# Patient Record
Sex: Male | Born: 1965 | Race: White | Hispanic: No | Marital: Single | Smoking: Never smoker
Health system: Southern US, Community
[De-identification: ages and names within clinical notes are randomized; demographics above are authoritative.]

## PROBLEM LIST (undated history)

## (undated) DIAGNOSIS — G8929 Other chronic pain: Secondary | ICD-10-CM

## (undated) DIAGNOSIS — F32A Depression, unspecified: Secondary | ICD-10-CM

## (undated) DIAGNOSIS — G40909 Epilepsy, unspecified, not intractable, without status epilepticus: Secondary | ICD-10-CM

## (undated) DIAGNOSIS — R519 Headache, unspecified: Secondary | ICD-10-CM

## (undated) DIAGNOSIS — G114 Hereditary spastic paraplegia: Secondary | ICD-10-CM

## (undated) DIAGNOSIS — R9439 Abnormal result of other cardiovascular function study: Secondary | ICD-10-CM

## (undated) DIAGNOSIS — E119 Type 2 diabetes mellitus without complications: Secondary | ICD-10-CM

## (undated) DIAGNOSIS — K648 Other hemorrhoids: Secondary | ICD-10-CM

## (undated) DIAGNOSIS — G894 Chronic pain syndrome: Secondary | ICD-10-CM

## (undated) DIAGNOSIS — K219 Gastro-esophageal reflux disease without esophagitis: Secondary | ICD-10-CM

## (undated) DIAGNOSIS — M5030 Other cervical disc degeneration, unspecified cervical region: Secondary | ICD-10-CM

## (undated) DIAGNOSIS — E876 Hypokalemia: Secondary | ICD-10-CM

## (undated) DIAGNOSIS — G4733 Obstructive sleep apnea (adult) (pediatric): Secondary | ICD-10-CM

## (undated) DIAGNOSIS — M810 Age-related osteoporosis without current pathological fracture: Secondary | ICD-10-CM

## (undated) DIAGNOSIS — F329 Major depressive disorder, single episode, unspecified: Secondary | ICD-10-CM

## (undated) DIAGNOSIS — E782 Mixed hyperlipidemia: Secondary | ICD-10-CM

## (undated) DIAGNOSIS — G4730 Sleep apnea, unspecified: Secondary | ICD-10-CM

## (undated) DIAGNOSIS — I10 Essential (primary) hypertension: Secondary | ICD-10-CM

## (undated) DIAGNOSIS — M549 Dorsalgia, unspecified: Secondary | ICD-10-CM

## (undated) DIAGNOSIS — E559 Vitamin D deficiency, unspecified: Secondary | ICD-10-CM

## (undated) HISTORY — DX: Other cervical disc degeneration, unspecified cervical region: M50.30

## (undated) HISTORY — DX: Age-related osteoporosis without current pathological fracture: M81.0

## (undated) HISTORY — PX: OTHER SURGICAL HISTORY: SHX169

## (undated) HISTORY — DX: Other hemorrhoids: K64.8

## (undated) HISTORY — DX: Headache, unspecified: R51.9

## (undated) HISTORY — DX: Major depressive disorder, single episode, unspecified: F32.9

## (undated) HISTORY — DX: Obstructive sleep apnea (adult) (pediatric): G47.33

## (undated) HISTORY — DX: Type 2 diabetes mellitus without complications: E11.9

## (undated) HISTORY — PX: HERNIA REPAIR: SHX51

## (undated) HISTORY — DX: Chronic pain syndrome: G89.4

## (undated) HISTORY — DX: Hypomagnesemia: E83.42

## (undated) HISTORY — DX: Mixed hyperlipidemia: E78.2

## (undated) HISTORY — DX: Depression, unspecified: F32.A

## (undated) HISTORY — DX: Hypokalemia: E87.6

## (undated) HISTORY — PX: APPENDECTOMY: SHX54

## (undated) HISTORY — DX: Vitamin D deficiency, unspecified: E55.9

## (undated) HISTORY — DX: Hereditary spastic paraplegia: G11.4

## (undated) HISTORY — PX: ORTHOPEDIC SURGERY: SHX850

---

## 2011-07-12 ENCOUNTER — Encounter (HOSPITAL_COMMUNITY): Payer: Self-pay

## 2011-07-12 ENCOUNTER — Other Ambulatory Visit: Payer: Self-pay | Admitting: Cardiovascular Disease

## 2011-07-18 ENCOUNTER — Encounter (HOSPITAL_COMMUNITY)
Admission: RE | Disposition: A | Discharge status: Home / Self Care | Payer: Self-pay | Source: Ambulatory Visit | Attending: Cardiovascular Disease

## 2011-07-18 ENCOUNTER — Encounter (HOSPITAL_COMMUNITY): Payer: Self-pay | Admitting: Cardiology

## 2011-07-18 ENCOUNTER — Ambulatory Visit (HOSPITAL_COMMUNITY)
Admission: RE | Admit: 2011-07-18 | Discharge: 2011-07-18 | Disposition: A | Discharge status: Home / Self Care | Payer: Medicare Other | Source: Ambulatory Visit | Attending: Cardiovascular Disease | Admitting: Cardiovascular Disease

## 2011-07-18 ENCOUNTER — Other Ambulatory Visit: Payer: Self-pay

## 2011-07-18 DIAGNOSIS — R079 Chest pain, unspecified: Secondary | ICD-10-CM | POA: Diagnosis present

## 2011-07-18 DIAGNOSIS — F3289 Other specified depressive episodes: Secondary | ICD-10-CM | POA: Insufficient documentation

## 2011-07-18 DIAGNOSIS — G4730 Sleep apnea, unspecified: Secondary | ICD-10-CM

## 2011-07-18 DIAGNOSIS — E785 Hyperlipidemia, unspecified: Secondary | ICD-10-CM | POA: Insufficient documentation

## 2011-07-18 DIAGNOSIS — Z79899 Other long term (current) drug therapy: Secondary | ICD-10-CM | POA: Insufficient documentation

## 2011-07-18 DIAGNOSIS — Z8249 Family history of ischemic heart disease and other diseases of the circulatory system: Secondary | ICD-10-CM | POA: Insufficient documentation

## 2011-07-18 DIAGNOSIS — G40909 Epilepsy, unspecified, not intractable, without status epilepticus: Secondary | ICD-10-CM

## 2011-07-18 DIAGNOSIS — R0989 Other specified symptoms and signs involving the circulatory and respiratory systems: Secondary | ICD-10-CM | POA: Insufficient documentation

## 2011-07-18 DIAGNOSIS — I10 Essential (primary) hypertension: Secondary | ICD-10-CM | POA: Insufficient documentation

## 2011-07-18 DIAGNOSIS — R9439 Abnormal result of other cardiovascular function study: Secondary | ICD-10-CM

## 2011-07-18 DIAGNOSIS — M549 Dorsalgia, unspecified: Secondary | ICD-10-CM | POA: Insufficient documentation

## 2011-07-18 DIAGNOSIS — R0789 Other chest pain: Secondary | ICD-10-CM | POA: Insufficient documentation

## 2011-07-18 DIAGNOSIS — F329 Major depressive disorder, single episode, unspecified: Secondary | ICD-10-CM | POA: Insufficient documentation

## 2011-07-18 DIAGNOSIS — R0609 Other forms of dyspnea: Secondary | ICD-10-CM | POA: Insufficient documentation

## 2011-07-18 DIAGNOSIS — R51 Headache: Secondary | ICD-10-CM | POA: Insufficient documentation

## 2011-07-18 DIAGNOSIS — Z7982 Long term (current) use of aspirin: Secondary | ICD-10-CM | POA: Insufficient documentation

## 2011-07-18 DIAGNOSIS — K219 Gastro-esophageal reflux disease without esophagitis: Secondary | ICD-10-CM

## 2011-07-18 DIAGNOSIS — G8929 Other chronic pain: Secondary | ICD-10-CM

## 2011-07-18 DIAGNOSIS — H538 Other visual disturbances: Secondary | ICD-10-CM | POA: Insufficient documentation

## 2011-07-18 DIAGNOSIS — G114 Hereditary spastic paraplegia: Secondary | ICD-10-CM | POA: Insufficient documentation

## 2011-07-18 HISTORY — DX: Abnormal result of other cardiovascular function study: R94.39

## 2011-07-18 HISTORY — DX: Sleep apnea, unspecified: G47.30

## 2011-07-18 HISTORY — DX: Epilepsy, unspecified, not intractable, without status epilepticus: G40.909

## 2011-07-18 HISTORY — DX: Hereditary spastic paraplegia: G11.4

## 2011-07-18 HISTORY — DX: Essential (primary) hypertension: I10

## 2011-07-18 HISTORY — DX: Other chronic pain: G89.29

## 2011-07-18 HISTORY — DX: Major depressive disorder, single episode, unspecified: F32.9

## 2011-07-18 HISTORY — PX: LEFT HEART CATHETERIZATION WITH CORONARY ANGIOGRAM: SHX5451

## 2011-07-18 HISTORY — DX: Depression, unspecified: F32.A

## 2011-07-18 HISTORY — DX: Dorsalgia, unspecified: M54.9

## 2011-07-18 HISTORY — DX: Gastro-esophageal reflux disease without esophagitis: K21.9

## 2011-07-18 LAB — BASIC METABOLIC PANEL
CO2: 26 mEq/L (ref 19–32)
Chloride: 102 mEq/L (ref 96–112)
Creatinine, Ser: 0.62 mg/dL (ref 0.50–1.35)
GFR calc Af Amer: >90 mL/min (ref >90)
Potassium: 4.2 mEq/L (ref 3.5–5.1)

## 2011-07-18 LAB — PROTIME-INR: INR: 1.12 (ref 0.00–1.49)

## 2011-07-18 LAB — CBC
HCT: 38.1 % — ABNORMAL LOW (ref 39.0–52.0)
Hemoglobin: 13.5 g/dL (ref 13.0–17.0)
MCV: 82.1 fL (ref 78.0–100.0)
RDW: 12.1 % (ref 11.5–15.5)
WBC: 7.7 10*3/uL (ref 4.0–10.5)

## 2011-07-18 LAB — GLUCOSE, CAPILLARY: Glucose-Capillary: 132 mg/dL — ABNORMAL HIGH (ref 70–99)

## 2011-07-18 SURGERY — LEFT HEART CATHETERIZATION WITH CORONARY ANGIOGRAM
Anesthesia: LOCAL

## 2011-07-18 MED ORDER — SODIUM CHLORIDE 0.9 % IJ SOLN: 3.0000 mL | INTRAMUSCULAR | Status: DC | PRN

## 2011-07-18 MED ORDER — DIAZEPAM 5 MG PO TABS: 5.0000 mg | ORAL_TABLET | ORAL | Status: DC

## 2011-07-18 MED ORDER — SODIUM CHLORIDE 0.9 % IV SOLN: INTRAVENOUS | Status: DC

## 2011-07-18 MED ORDER — HEPARIN (PORCINE) IN NACL 2-0.9 UNIT/ML-% IJ SOLN
INTRAMUSCULAR | Status: AC
  Filled 2011-07-18: qty 2000

## 2011-07-18 MED ORDER — SODIUM CHLORIDE 0.9 % IV SOLN
INTRAVENOUS | Status: DC
  Administered 2011-07-18: 09:00:00 via INTRAVENOUS

## 2011-07-18 MED ORDER — ACETAMINOPHEN 325 MG PO TABS: 650.0000 mg | ORAL_TABLET | ORAL | Status: DC | PRN

## 2011-07-18 MED ORDER — ATROPINE SULFATE 1 MG/ML IJ SOLN
INTRAMUSCULAR | Status: AC
  Filled 2011-07-18: qty 1

## 2011-07-18 MED ORDER — GI COCKTAIL ~~LOC~~
30.0000 mL | ORAL | Status: AC
  Administered 2011-07-18: 30 mL via ORAL
  Filled 2011-07-18: qty 30

## 2011-07-18 MED ORDER — NITROGLYCERIN 0.2 MG/ML ON CALL CATH LAB
INTRAVENOUS | Status: AC
  Filled 2011-07-18: qty 1

## 2011-07-18 MED ORDER — LIDOCAINE HCL (PF) 1 % IJ SOLN
INTRAMUSCULAR | Status: AC
  Filled 2011-07-18: qty 30

## 2011-07-18 MED ORDER — ONDANSETRON HCL 4 MG/2ML IJ SOLN: 4.0000 mg | Freq: Four times a day (QID) | INTRAMUSCULAR | Status: DC | PRN

## 2011-07-18 NOTE — H&P (Signed)
.    Pt was reexamined and existing H & P reviewed. No changes found.  Runell Gess, MD [facility] 07/18/2011 12:44 PM

## 2011-07-18 NOTE — Op Note (Signed)
Douglas Salinas is a 46 y.o. male    [id number] LOCATION:  FACILITY: [facility]  PHYSICIAN: Nanetta Batty, M.D. 1966-04-15   DATE OF PROCEDURE:  07/18/2011  DATE OF DISCHARGE:  [facility]  CARDIAC CATHETERIZATION     History obtained from chart review. 46 year old male with some cognitive impairment versus appearance of cognitive impairment due to motor skill issues presents today for cardiac catheterization. He has a recent history of chest pain that will last for minutes left chest and substernal area severe pressure sensation and some sharp pain associated it does radiate to his left arm. The symptoms have been associated with blurred vision, diaphoresis, dyspnea and headache. If patient were active during the pain it does get worse. He has taken nitroglycerin times one and with rest the pains resolved.  He also has a history of hyperlipidemia and hypertension and strong family history of coronary artery disease with both his mother and father.  He had a positive stress Myoview would like for scan revealing small perfusion abnormality of moderate intensity in the mid anterior and apical anterior myocardial wall. EF was 58%. Patient has been started on Imdur (the insurance would not pay for Ranexa) without improvement of his chest pain.    PROCEDURE DESCRIPTION:    The patient was brought to the second floor  [facility] lab in the postabsorptive state. He was  premedicated with Valium 5 mg by mouth. His right groin was prepped and shaved in usual sterile fashion. Xylocaine 1% was used  for local anesthesia. A 5 French sheath was inserted into the right femoral  artery using standard Seldinger technique.5 French right and left Judkins diagnostic catheters along with a 5 French pigtail catheter were used for selective coronary angiography and left ventriculography respectively. Visipaque dye was used for the entirety of the case. Retrograde aortic, left  ventricular pullback pressures were recorded.    HEMODYNAMICS:    AO SYSTOLIC/AO DIASTOLIC: 106/71   LV SYSTOLIC/LV DIASTOLIC: 99/12  ANGIOGRAPHIC RESULTS:   1. Left main; normal  2. LAD; normal 3. Left circumflex; normal.  4. Right coronary artery; dominant and normal 5. Left ventriculography; RAO left ventriculogram was performed using  25 mL of Visipaque dye at 12 mL/second. The overall LVEF estimated  60 %  With/Without wall motion abnormalities  IMPRESSION:Mr. Ficco has an essentially normal coronary arteries and normal left function. His stress test was false positive as chest pain noncardiac. His right femoral artery a puncture site was sealed using the "Angio-Seal" device with excellent hemostasis. The patient left the Cath Lab in stable condition. I have discussed the results of his cardiac catheterization with his primary cardiologist Arnette Felts [facility] in [facility]. He'll be discharged home later today as outpatient will followup with his primary cardiologist.  Runell Gess. MD, [facility] 07/18/2011 1:21 PM

## 2011-07-18 NOTE — H&P (Signed)
Douglas Salinas is an 46 y.o. male.   Primary Cardiologist @ [facility]. Cardiologist in [place]:  Dr. Allyson Sabal.     Chief Complaint: episodic chest pain. HPI: 46 year old male with some cognitive impairment versus appearance of cognitive impairment due to motor skill issues presents today for cardiac catheterization. He has a recent history of chest pain that will last for minutes left chest and substernal area severe pressure sensation and some sharp pain associated it does radiate to his left arm. The symptoms have been associated with blurred vision, diaphoresis, dyspnea and headache. If patient were active during the pain it does get worse. He has taken nitroglycerin times one and with rest the pains resolved.  He also has a history of hyperlipidemia and hypertension and strong family history of coronary artery disease with both his mother and father.  He had a positive stress Myoview would like for scan revealing small perfusion abnormality of moderate intensity in the mid anterior and apical anterior myocardial wall. EF was 58%.  Patient has been started on Imdur (the insurance would not pay for Ranexa) without improvement of his chest pain.  Additionally the patient has chronic back pain and has a pain pump in his right lower abdominal region with catheter to the spine with infusion of morphine and baclofen with 3 infusions a day, he states the back pain is not completely resolved but is improved but he is afraid to go up on the dose of the morphine and baclofen.  Other history includes sleep apnea gastroesophageal reflux disease, abnormality of the date his back pain secondary to thoracic or lumbosacral neuritis radiculitis.  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  He is in a wheelchair most of the time and is able to transfer himself from bed to  wheelchair and from wheelchair to chair.  Past Medical History  Diagnosis Date  . Chest pain, not improved with Ranexa and Imdur 07/18/2011  . Abnormal stress test, stress myoview. with ischemia in the mid anterior and apical ant. wall 07/18/2011  . Chronic back pain 07/18/2011  . GERD (gastroesophageal reflux disease) 07/18/2011  . Sleep apnea 07/18/2011  . Hypertension   . Depression   . Epilepsy 07/18/2011  . 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 No pertinent past surgical history.  Family History  Problem Relation Age of Onset  . Coronary artery disease Mother   . Diabetes Mother   . Hyperlipidemia Mother   . Coronary artery disease Father   . Hyperlipidemia Father   . Hyperlipidemia Brother    Social History:  reports that he has never smoked. He has never used smokeless tobacco. He reports that he does not drink alcohol or use illicit drugs. lives with his mother due to his physical issues.  Allergies:  Allergies  Allergen Reactions  . Codeine Nausea And Vomiting  . Vicodin (Hydrocodone-Acetaminophen) Other (See Comments)    Causes seizures  also allergy to  Tape  Must use paper tape  Medications Prior to Admission  Medication Dose Route Frequency Provider Last Rate Last Dose  . 0.9 %  sodium chloride infusion   Intravenous Continuous Runell Gess, MD 75 mL/hr at 07/18/11 0849    . diazepam (VALIUM) tablet 5 mg  5 mg Oral On Call Runell Gess, MD      . sodium chloride 0.9 % injection  3 mL  3 mL Intravenous PRN Runell Gess, MD       No current outpatient prescriptions on file as of 07/18/2011.   Out Patient Medications: Amlodipine 2.5 mg daily Aspirin 81 mg daily Tegretol XR 400 mg twice a day Cymbalta 30 mg one daily Fentanyl Duragesic patch 75 mcg an hour change every 72 hours Imdur 30 mg 1 daily Multivitamin daily next number nitroglycerin 0.4 mg sublingual when necessary next number Percocet 5/325 one tablet by mouth twice a day  next number Protonix 40 mg daily Zocor 40 mg daily  Results for orders placed during the hospital encounter of 07/18/11 (from the past 48 hour(s))  BASIC METABOLIC PANEL     Status: Abnormal   Collection Time   07/18/11  8:32 AM      Component Value Range Comment   Sodium 141  135 - 145 (mEq/L)    Potassium 4.2  3.5 - 5.1 (mEq/L)    Chloride 102  96 - 112 (mEq/L)    CO2 26  19 - 32 (mEq/L)    Glucose, Bld 119 (*) 70 - 99 (mg/dL)    BUN 8  6 - 23 (mg/dL)    Creatinine, Ser 6.29  0.50 - 1.35 (mg/dL)    Calcium 9.1  8.4 - 10.5 (mg/dL)    GFR calc non Af Amer >90  >90 (mL/min)    GFR calc Af Amer >90  >90 (mL/min)   CBC     Status: Abnormal   Collection Time   07/18/11  8:32 AM      Component Value Range Comment   WBC 7.7  4.0 - 10.5 (K/uL)    RBC 4.64  4.22 - 5.81 (MIL/uL)    Hemoglobin 13.5  13.0 - 17.0 (g/dL)    HCT 52.8 (*) 41.3 - 52.0 (%)    MCV 82.1  78.0 - 100.0 (fL)    MCH 29.1  26.0 - 34.0 (pg)    MCHC 35.4  30.0 - 36.0 (g/dL)    RDW 24.4  01.0 - 27.2 (%)    Platelets 239  150 - 400 (K/uL)   PROTIME-INR     Status: Normal   Collection Time   07/18/11  8:32 AM      Component Value Range Comment   Prothrombin Time 14.6  11.6 - 15.2 (seconds)    INR 1.12  0.00 - 1.49     No results found.  ROS: Gen. No colds or fevers. Skin: Without rashes. HEENT occasional blurred vision with chest pain. Cardiovascular see history of present illness. Pulmonary: No shortness of breath GI: No diarrhea constipation or melena GU: Occasional dysuria. Musculoskeletal chronic back pain with morphine baclofen pump Neuro: History of seizures Endocrine: No diabetes.   Blood pressure 136/84, pulse 60, temperature 97.2 F (36.2 C), temperature source Oral, resp. rate 18, height 6\' 3"  (1.905 m), weight 99.338 kg (219 lb), SpO2 94.00%. PE: Gen.: Currently after IV stick patient vasovagal is pale and clammy. Denies chest pain and continues with pleasant affect. Skin: Warm and clammy. Brisk  capillary refill. HEENT normocephalic sclera clear. Neck: Supple no JVD no bruits or thyromegaly. Heart: S1-S2 regular rate and rhythm without murmur gallop rub or click Lungs: Clear without rales rhonchi or wheezes. Abdomen positive bowel sounds do not palpate liver spleen or masses. There is having incision in his right lower abdominal wall for a pain pump. Extremities: Pedal pulses present. No lower extremity edema. Neuro: Alert and oriented to difficulty expressing  himself, cognitive impairment.   Assessment/Plan Patient Active Problem List  Diagnoses  . Chest pain, not improved with Ranexa and Imdur  . Abnormal stress test, stress myoview. with ischemia in the mid anterior and apical ant. wall  . Chronic back pain  . GERD (gastroesophageal reflux disease)  . Sleep apnea, use cpap at times  . Epilepsy  . Familial spastic paraplegia   PLAN: .Dr. Allyson Sabal has seen and examined the patient as well as myself.  Currently improved color after vasovagal episode with IV stick, patient states he has these episodes when he is dehydrated. That with his neuromuscular disease this may be a symptom of the disease.  Patient is aware of cardiac catheterization and possible need for intervention.  His mother has been with him throughout the interview. Ranexa was never started because the insurance would not pay for           Shakeria Robinette R 07/18/2011, 9:46 AM   &P

## 2013-04-26 ENCOUNTER — Emergency Department (HOSPITAL_BASED_OUTPATIENT_CLINIC_OR_DEPARTMENT_OTHER)
Admission: EM | Admit: 2013-04-26 | Discharge: 2013-04-26 | Disposition: A | Discharge status: Home / Self Care | Payer: Medicare Other | Attending: Emergency Medicine | Admitting: Emergency Medicine

## 2013-04-26 ENCOUNTER — Emergency Department (HOSPITAL_BASED_OUTPATIENT_CLINIC_OR_DEPARTMENT_OTHER): Payer: Medicare Other

## 2013-04-26 ENCOUNTER — Encounter (HOSPITAL_BASED_OUTPATIENT_CLINIC_OR_DEPARTMENT_OTHER): Payer: Self-pay | Admitting: Emergency Medicine

## 2013-04-26 DIAGNOSIS — Z7982 Long term (current) use of aspirin: Secondary | ICD-10-CM | POA: Insufficient documentation

## 2013-04-26 DIAGNOSIS — M79609 Pain in unspecified limb: Secondary | ICD-10-CM | POA: Insufficient documentation

## 2013-04-26 DIAGNOSIS — M7989 Other specified soft tissue disorders: Secondary | ICD-10-CM | POA: Insufficient documentation

## 2013-04-26 DIAGNOSIS — G40909 Epilepsy, unspecified, not intractable, without status epilepticus: Secondary | ICD-10-CM | POA: Insufficient documentation

## 2013-04-26 DIAGNOSIS — I10 Essential (primary) hypertension: Secondary | ICD-10-CM | POA: Insufficient documentation

## 2013-04-26 DIAGNOSIS — K219 Gastro-esophageal reflux disease without esophagitis: Secondary | ICD-10-CM | POA: Insufficient documentation

## 2013-04-26 DIAGNOSIS — G8929 Other chronic pain: Secondary | ICD-10-CM | POA: Insufficient documentation

## 2013-04-26 DIAGNOSIS — M79602 Pain in left arm: Secondary | ICD-10-CM

## 2013-04-26 DIAGNOSIS — F329 Major depressive disorder, single episode, unspecified: Secondary | ICD-10-CM | POA: Insufficient documentation

## 2013-04-26 DIAGNOSIS — G114 Hereditary spastic paraplegia: Secondary | ICD-10-CM | POA: Insufficient documentation

## 2013-04-26 DIAGNOSIS — Z79899 Other long term (current) drug therapy: Secondary | ICD-10-CM | POA: Insufficient documentation

## 2013-04-26 DIAGNOSIS — F3289 Other specified depressive episodes: Secondary | ICD-10-CM | POA: Insufficient documentation

## 2013-04-26 LAB — CBC WITH DIFFERENTIAL/PLATELET
Basophils Relative: 0 % (ref 0–1)
HCT: 34.5 % — ABNORMAL LOW (ref 39.0–52.0)
Hemoglobin: 11.8 g/dL — ABNORMAL LOW (ref 13.0–17.0)
Lymphocytes Relative: 14 % (ref 12–46)
Lymphs Abs: 1.5 10*3/uL (ref 0.7–4.0)
MCHC: 34.2 g/dL (ref 30.0–36.0)
Monocytes Absolute: 0.4 10*3/uL (ref 0.1–1.0)
Monocytes Relative: 3 % (ref 3–12)
Neutro Abs: 8.4 10*3/uL — ABNORMAL HIGH (ref 1.7–7.7)
Neutrophils Relative %: 79 % — ABNORMAL HIGH (ref 43–77)
RBC: 4.02 MIL/uL — ABNORMAL LOW (ref 4.22–5.81)
WBC: 10.6 10*3/uL — ABNORMAL HIGH (ref 4.0–10.5)

## 2013-04-26 LAB — BASIC METABOLIC PANEL
BUN: 13 mg/dL (ref 6–23)
CO2: 33 mEq/L — ABNORMAL HIGH (ref 19–32)
Chloride: 97 mEq/L (ref 96–112)
Creatinine, Ser: 0.8 mg/dL (ref 0.50–1.35)
GFR calc Af Amer: >90 mL/min (ref >90)
Glucose, Bld: 120 mg/dL — ABNORMAL HIGH (ref 70–99)
Potassium: 3.9 mEq/L (ref 3.5–5.1)

## 2013-04-26 LAB — D-DIMER, QUANTITATIVE: D-Dimer, Quant: 0.72 ug/mL-FEU — ABNORMAL HIGH (ref 0.00–0.48)

## 2013-04-26 MED ORDER — MORPHINE SULFATE 4 MG/ML IJ SOLN
2.0000 mg | Freq: Once | INTRAMUSCULAR | Status: AC
  Administered 2013-04-26: 2 mg via INTRAVENOUS
  Filled 2013-04-26: qty 1

## 2013-04-26 MED ORDER — SODIUM CHLORIDE 0.9 % IV BOLUS (SEPSIS)
1000.0000 mL | Freq: Once | INTRAVENOUS | Status: AC
  Administered 2013-04-26: 1000 mL via INTRAVENOUS

## 2013-04-26 MED ORDER — ENOXAPARIN SODIUM 40 MG/0.4ML ~~LOC~~ SOLN
30.0000 mg | Freq: Once | SUBCUTANEOUS | Status: AC
  Administered 2013-04-26: 30 mg via SUBCUTANEOUS
  Filled 2013-04-26: qty 0.4

## 2013-04-26 MED ORDER — ONDANSETRON HCL 4 MG/2ML IJ SOLN
4.0000 mg | Freq: Once | INTRAMUSCULAR | Status: AC
  Administered 2013-04-26: 4 mg via INTRAVENOUS
  Filled 2013-04-26: qty 2

## 2013-04-26 NOTE — ED Provider Notes (Signed)
CSN: [id number]     Arrival date & time 04/26/13  1442 History   First MD Initiated Contact with Patient 04/26/13 1555     Chief Complaint  Patient presents with  . Extremity Pain   (Consider location/radiation/quality/duration/timing/severity/associated sxs/prior Treatment) HPI Comments: Patient is a 47 year old male with a past medical history of paraplegia who presents with a 2 week history of left arm pain. Symptoms started gradually and progressively worsened since the onset. The pain is throbbing and severe and radiates down his entire left arm. Palpation of the arm makes the pain worse. No alleviating factors. Patient reports associated edema. Patient has seen a provider at [facility] who sent the patient here for DVT rule out.    Past Medical History  Diagnosis Date  . Chest pain, not improved with Ranexa and Imdur 07/18/2011  . Abnormal stress test, stress myoview. with ischemia in the mid anterior and apical ant. wall 07/18/2011  . Chronic back pain 07/18/2011  . GERD (gastroesophageal reflux disease) 07/18/2011  . Sleep apnea 07/18/2011  . Hypertension   . Depression   . Epilepsy 07/18/2011  . 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 Hernia repair    . Appendectomy    . Orthopedic surgery     Family History  Problem Relation Age of Onset  . Coronary artery disease Mother   . Diabetes Mother   . Hyperlipidemia Mother   . Coronary artery disease Father   . Hyperlipidemia Father   . Hyperlipidemia Brother    History  Substance Use Topics  . Smoking status: Never Smoker   . Smokeless tobacco: Never Used  . Alcohol Use: No    Review of Systems  Musculoskeletal: Positive for arthralgias, joint swelling and myalgias.  All other systems reviewed and are negative.    Allergies  Codeine and Vicodin  Home Medications   Current Outpatient Rx  Name  Route  Sig  Dispense  Refill  . amLODipine (NORVASC) 2.5  MG tablet   Oral   Take 2.5 mg by mouth daily.         Marland Kitchen aspirin EC 81 MG tablet   Oral   Take 81 mg by mouth daily.         . carbamazepine (TEGRETOL XR) 400 MG 12 hr tablet   Oral   Take 400 mg by mouth 2 (two) times daily.         . DULoxetine (CYMBALTA) 30 MG capsule   Oral   Take 30 mg by mouth at bedtime.         . fentaNYL (DURAGESIC - DOSED MCG/HR) 75 MCG/HR   Transdermal   Place 1 patch onto the skin every 3 (three) days. Placed new patch on 07/12/11 per patient         . isosorbide mononitrate (IMDUR) 30 MG 24 hr tablet   Oral   Take 30 mg by mouth daily.         . Multiple Vitamin (MULITIVITAMIN WITH MINERALS) TABS   Oral   Take 1 tablet by mouth daily.         . nitroGLYCERIN (NITROSTAT) 0.4 MG SL tablet   Sublingual   Place 0.4 mg under the tongue every 5 (five) minutes as needed. For chest pain         . oxyCODONE-acetaminophen (PERCOCET) 5-325 MG per tablet   Oral   Take 1 tablet by mouth 2 (two) times  daily.         . pantoprazole (PROTONIX) 40 MG tablet   Oral   Take 40 mg by mouth at bedtime.         . simvastatin (ZOCOR) 40 MG tablet   Oral   Take 40 mg by mouth at bedtime.          BP 118/65  Temp(Src) 98.5 F (36.9 C) (Oral)  Resp 16  Wt 230 lb (104.327 kg)  SpO2 95% Physical Exam  Nursing note and vitals reviewed. Constitutional: He is oriented to person, place, and time. He appears well-developed and well-nourished. No distress.  HENT:  Head: Normocephalic and atraumatic.  Eyes: Conjunctivae are normal.  Neck: Normal range of motion.  Cardiovascular: Normal rate, regular rhythm and intact distal pulses.  Exam reveals no gallop and no friction rub.   No murmur heard. Sufficient capillary refill of distal extremity. Left radial pulse intact.   Pulmonary/Chest: Effort normal and breath sounds normal. He has no wheezes. He has no rales. He exhibits no tenderness.  Abdominal: Soft. There is no tenderness.   Musculoskeletal: Normal range of motion.  Left forearm generalized edema and tenderness to palpation. ROM limited due to pain and swelling. No obvious deformity.   Neurological: He is alert and oriented to person, place, and time. Coordination normal.  Speech is goal-oriented. Moves limbs without ataxia.   Skin: Skin is warm and dry.  Psychiatric: He has a normal mood and affect. His behavior is normal.    ED Course  Procedures (including critical care time) Labs Review Labs Reviewed  CBC WITH DIFFERENTIAL - Abnormal; Notable for the following:    WBC 10.6 (*)    RBC 4.02 (*)    Hemoglobin 11.8 (*)    HCT 34.5 (*)    Neutrophils Relative % 79 (*)    Neutro Abs 8.4 (*)    All other components within normal limits  BASIC METABOLIC PANEL - Abnormal; Notable for the following:    CO2 33 (*)    Glucose, Bld 120 (*)    All other components within normal limits  D-DIMER, QUANTITATIVE - Abnormal; Notable for the following:    D-Dimer, Quant 0.72 (*)    All other components within normal limits   Imaging Review Dg Elbow Complete Left  04/26/2013   CLINICAL DATA:  Left elbow pain/swelling  EXAM: LEFT ELBOW - COMPLETE 3+ VIEW  COMPARISON:  None.  FINDINGS: No fracture or dislocation is seen.  The joint spaces are preserved.  No displaced elbow joint fat pads to suggest elbow joint effusion.  Soft tissue swelling overlying the olecranon, suggesting olecranon bursitis.  IMPRESSION: Soft tissue swelling overlying the olecranon, suggesting olecranon bursitis.   Electronically Signed   By: Charline Bills M.D.   On: 04/26/2013 16:37    EKG Interpretation   None       MDM   1. Left arm swelling   2. Left arm pain     4:17 PM Labs and elbow xray pending. Patient will have morphine and zofran for symptoms. Distal left arm pulses intact. We do not have [place] capability on the weekend at [facility]. Patient will have d-dimer.   6:40 PM Xray shows possible olecranon bursitis. Labs show slightly  elevated WBC. Patient's d-dimer positive. Patient will have lovenox here and have upper extremity US tomorrow. Vitals stable and patient afebrile. Patient denies chest pain, SOB.     Emilia Beck, PA-C 04/26/13 1847

## 2013-04-26 NOTE — ED Notes (Signed)
SpO2 88% patient placed on 2 lpm [place].   SpO2 increased to 97%.

## 2013-04-26 NOTE — ED Provider Notes (Signed)
Medical screening examination/treatment/procedure(s) were performed by non-physician practitioner and as supervising physician I was immediately available for consultation/collaboration.  EKG Interpretation   None        Yaasir Menken, MD 04/26/13 2136 

## 2013-04-26 NOTE — ED Notes (Signed)
PA at bedside.

## 2013-04-26 NOTE — ED Notes (Signed)
Here from [facility] for evaluation to r/o DVT to left arm.  Reports pain, swelling to left arm x one week.

## 2013-04-26 NOTE — ED Notes (Signed)
MD at bedside. 

## 2013-04-27 ENCOUNTER — Ambulatory Visit (HOSPITAL_COMMUNITY)
Admission: RE | Admit: 2013-04-27 | Discharge: 2013-04-27 | Disposition: A | Discharge status: Home / Self Care | Payer: Medicare Other | Source: Ambulatory Visit | Attending: Emergency Medicine | Admitting: Emergency Medicine

## 2013-04-27 DIAGNOSIS — M25522 Pain in left elbow: Secondary | ICD-10-CM

## 2013-04-27 DIAGNOSIS — M79609 Pain in unspecified limb: Secondary | ICD-10-CM | POA: Diagnosis present

## 2013-04-27 DIAGNOSIS — M7989 Other specified soft tissue disorders: Secondary | ICD-10-CM | POA: Diagnosis not present

## 2013-04-27 NOTE — Progress Notes (Signed)
VASCULAR LAB PRELIMINARY  PRELIMINARY  PRELIMINARY  PRELIMINARY  Left upper extremity venous Doppler completed.    Preliminary report:  There is no DVT or SVT noted in the left upper extremity.  Kharisma Glasner, RVT 04/27/2013, 5:39 PM

## 2013-04-28 ENCOUNTER — Other Ambulatory Visit (HOSPITAL_COMMUNITY): Payer: Self-pay | Admitting: Emergency Medicine

## 2013-04-28 DIAGNOSIS — M7989 Other specified soft tissue disorders: Secondary | ICD-10-CM

## 2013-04-28 DIAGNOSIS — M25522 Pain in left elbow: Secondary | ICD-10-CM

## 2014-03-31 IMAGING — CR DG ELBOW COMPLETE 3+V*L*
4 series · 4 of 4 positions shown · non-contrast
Comparison: None.

CLINICAL DATA: Left elbow pain/swelling

EXAM:
LEFT ELBOW - COMPLETE 3+ VIEW

[x elbow joint ap left]
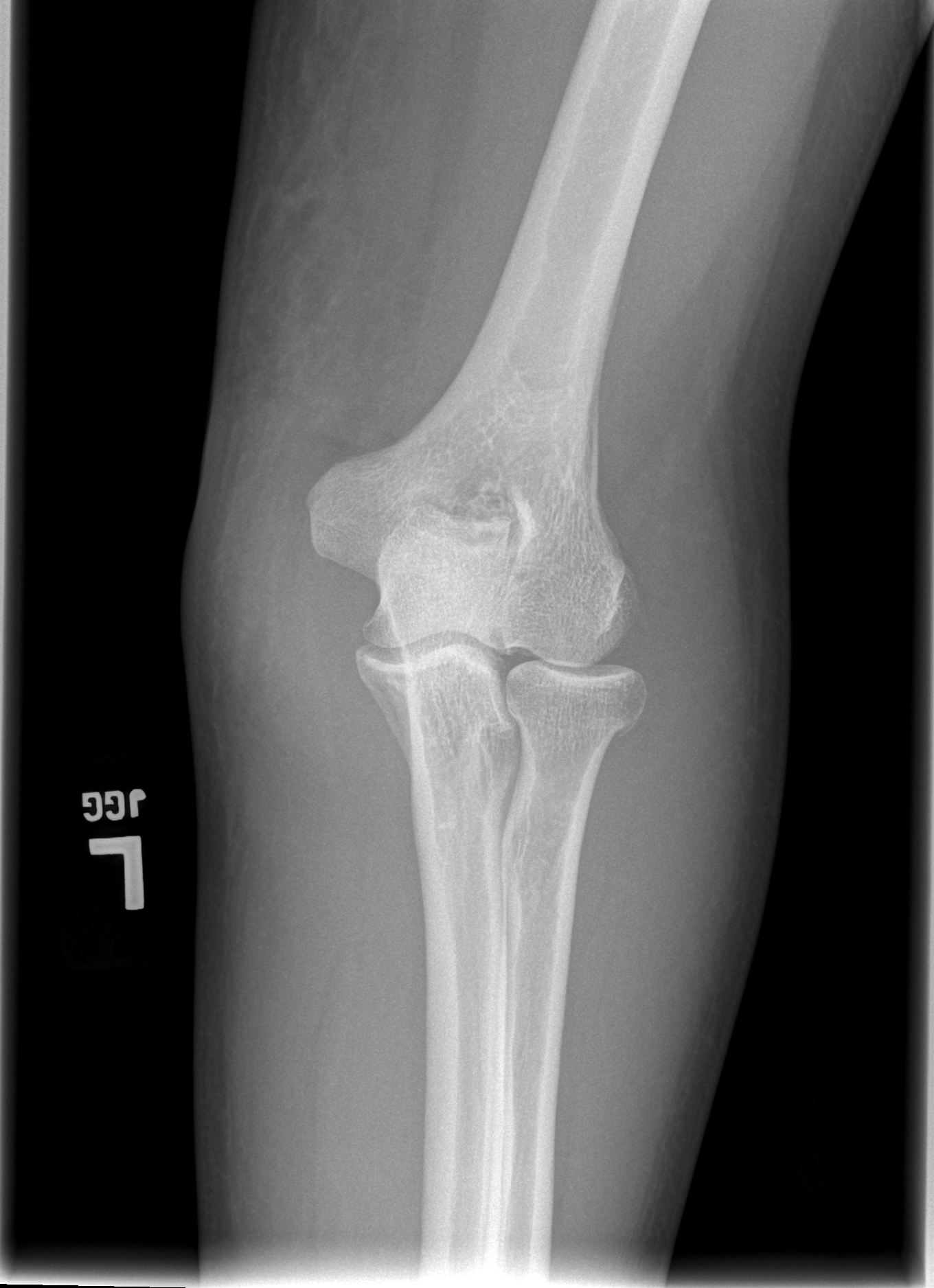

[x elbow joint obl. left (1 of 2)]
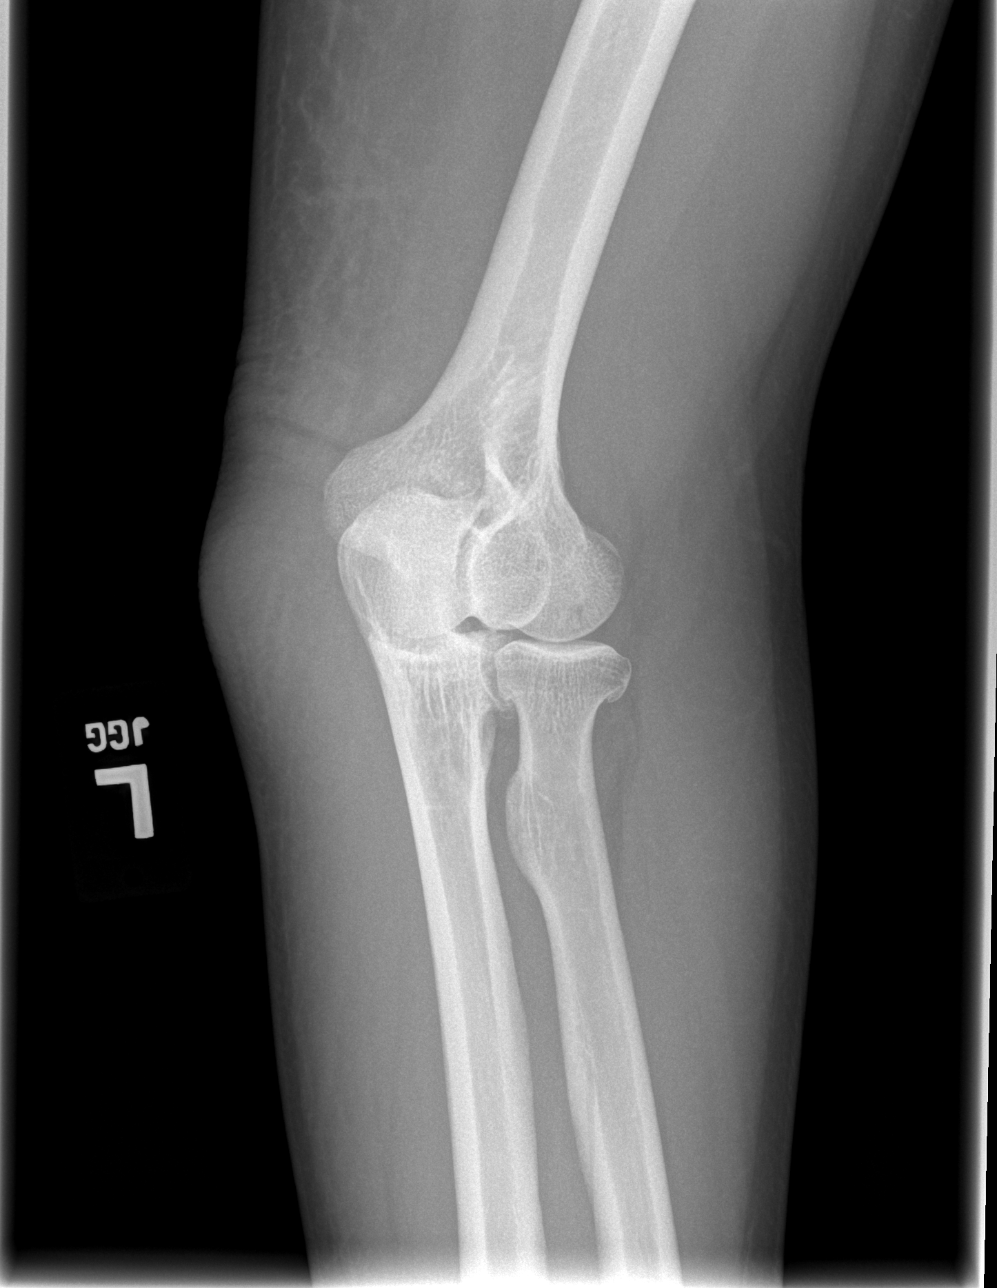

[x elbow joint obl. left (2 of 2)]
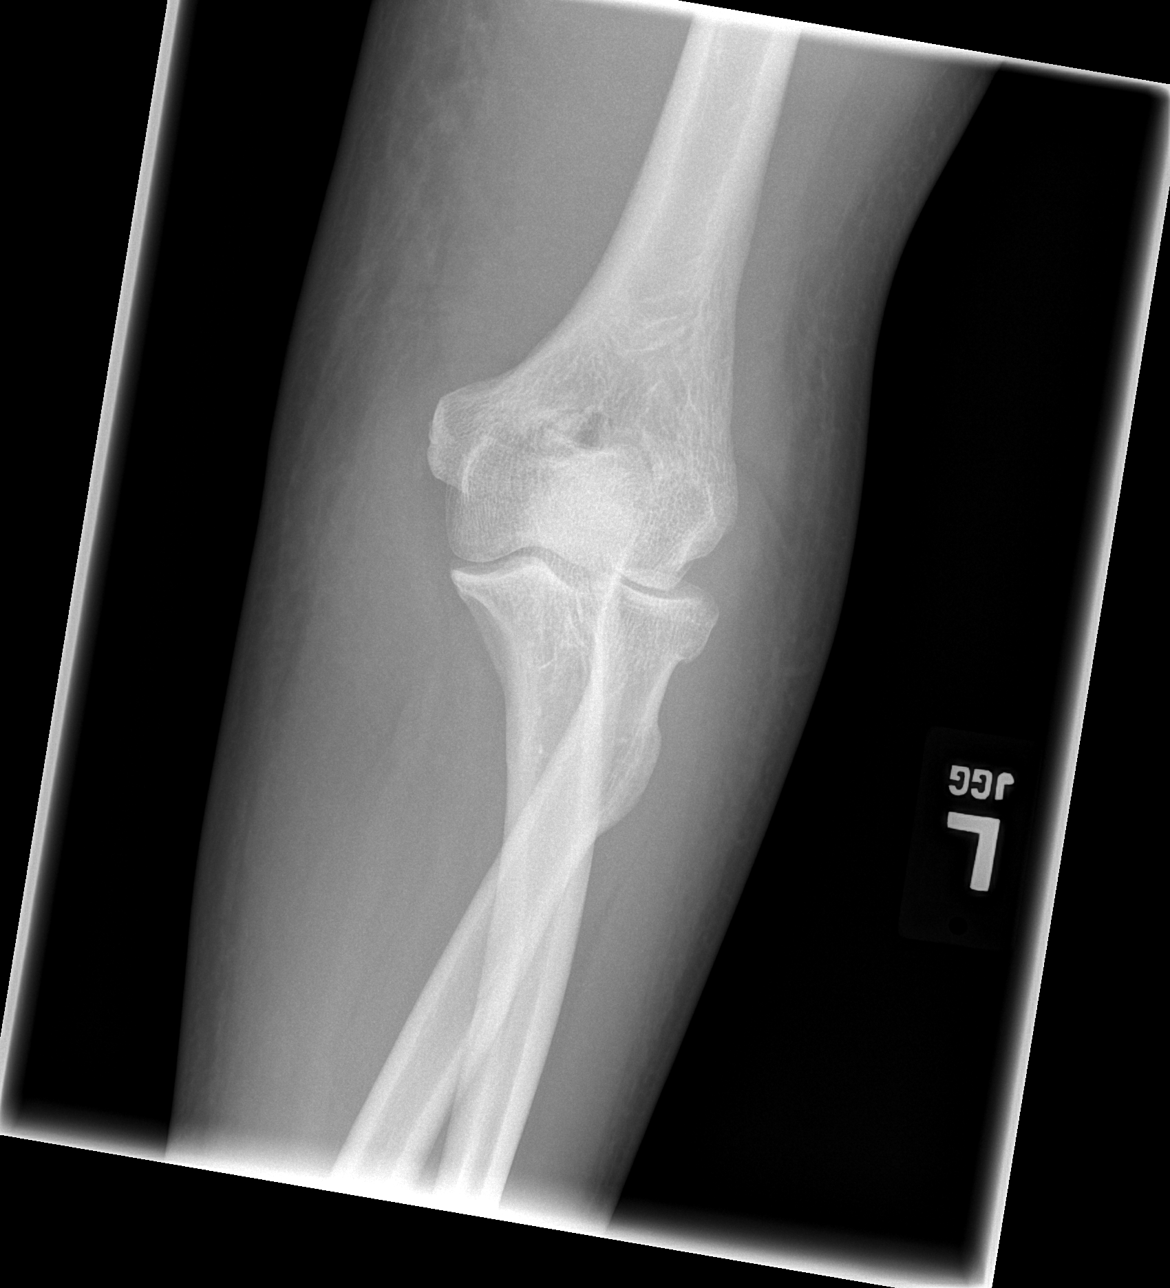

[x elbow joint lat left]
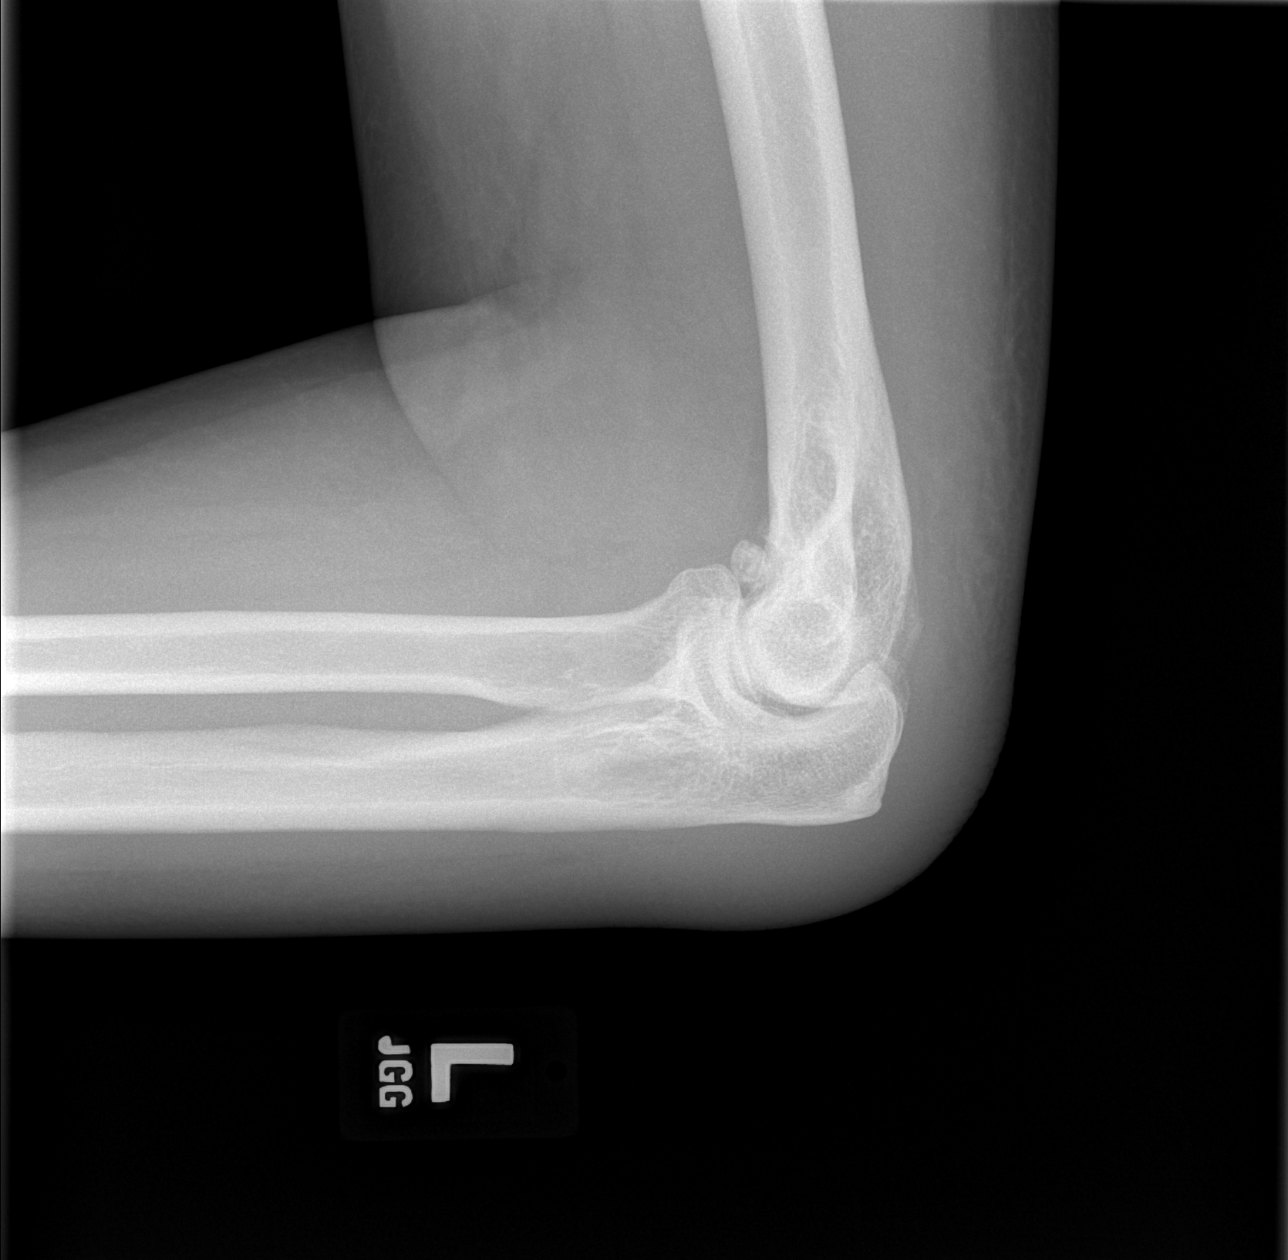

[4 of 4 positions shown; findings below may reference images not displayed]

FINDINGS: No fracture or dislocation is seen.

The joint spaces are preserved.

No displaced elbow joint fat pads to suggest elbow joint effusion.

Soft tissue swelling overlying the olecranon, suggesting olecranon
bursitis.
IMPRESSION: Soft tissue swelling overlying the olecranon, suggesting olecranon
bursitis.

## 2014-05-28 ENCOUNTER — Encounter (HOSPITAL_COMMUNITY): Payer: Self-pay | Admitting: Cardiovascular Disease

## 2019-12-24 ENCOUNTER — Telehealth: Payer: Self-pay

## 2019-12-24 NOTE — Telephone Encounter (Signed)
NOTES ON FILE FROM [facility] [phone], SENT REFERRAL TO SCHEDULING 

## 2020-01-26 ENCOUNTER — Other Ambulatory Visit (HOSPITAL_COMMUNITY)
Admission: RE | Admit: 2020-01-26 | Discharge: 2020-01-26 | Disposition: A | Discharge status: Home / Self Care | Payer: Medicare Other | Source: Ambulatory Visit | Attending: Internal Medicine | Admitting: Internal Medicine

## 2020-01-26 ENCOUNTER — Other Ambulatory Visit: Payer: Self-pay

## 2020-01-26 ENCOUNTER — Encounter: Payer: Self-pay | Admitting: Internal Medicine

## 2020-01-26 ENCOUNTER — Ambulatory Visit (INDEPENDENT_AMBULATORY_CARE_PROVIDER_SITE_OTHER): Payer: Medicare Other | Admitting: Internal Medicine

## 2020-01-26 ENCOUNTER — Encounter: Payer: Self-pay | Admitting: *Deleted

## 2020-01-26 VITALS — BP 110/70 | HR 46 | Ht 75.0 in

## 2020-01-26 DIAGNOSIS — R55 Syncope and collapse: Secondary | ICD-10-CM

## 2020-01-26 DIAGNOSIS — Z20822 Contact with and (suspected) exposure to covid-19: Secondary | ICD-10-CM | POA: Insufficient documentation

## 2020-01-26 DIAGNOSIS — Z01812 Encounter for preprocedural laboratory examination: Secondary | ICD-10-CM | POA: Insufficient documentation

## 2020-01-26 DIAGNOSIS — R001 Bradycardia, unspecified: Secondary | ICD-10-CM

## 2020-01-26 LAB — BASIC METABOLIC PANEL
BUN/Creatinine Ratio: 13 (ref 9–20)
BUN: 10 mg/dL (ref 6–24)
CO2: 27 mmol/L (ref 20–29)
Calcium: 9.3 mg/dL (ref 8.7–10.2)
Chloride: 100 mmol/L (ref 96–106)
Creatinine, Ser: 0.79 mg/dL (ref 0.76–1.27)
GFR calc Af Amer: 116 mL/min/{1.73_m2} (ref >59)
GFR calc non Af Amer: 100 mL/min/{1.73_m2} (ref >59)
Glucose: 99 mg/dL (ref 65–99)
Potassium: 4.2 mmol/L (ref 3.5–5.2)
Sodium: 138 mmol/L (ref 134–144)

## 2020-01-26 LAB — CBC
Hematocrit: 41.2 % (ref 37.5–51.0)
Hemoglobin: 13.5 g/dL (ref 13.0–17.7)
MCH: 28.9 pg (ref 26.6–33.0)
MCHC: 32.8 g/dL (ref 31.5–35.7)
MCV: 88 fL (ref 79–97)
Platelets: 250 10*3/uL (ref 150–450)
RBC: 4.67 x10E6/uL (ref 4.14–5.80)
RDW: 12.4 % (ref 11.6–15.4)
WBC: 6.7 10*3/uL (ref 3.4–10.8)

## 2020-01-26 LAB — SARS CORONAVIRUS 2 (TAT 6-24 HRS): SARS Coronavirus 2: NEGATIVE

## 2020-01-26 NOTE — Patient Instructions (Addendum)
Medication Instructions:  Your physician recommends that you continue on your current medications as directed. Please refer to the Current Medication list given to you today.  *If you need a refill on your cardiac medications before your next appointment, please call your pharmacy*  Lab Work: None ordered.  If you have labs (blood work) drawn today and your tests are completely normal, you will receive your results only by: . MyChart Message (if you have MyChart) OR . A paper copy in the mail If you have any lab test that is abnormal or we need to change your treatment, we will call you to review the results.  Testing/Procedures: None ordered.  Follow-Up: At [facility], you and your health needs are our priority.  As part of our continuing mission to provide you with exceptional heart care, we have created designated Provider Care Teams.  These Care Teams include your primary Cardiologist (physician) and Advanced Practice Providers (APPs -  Physician Assistants and Nurse Practitioners) who all work together to provide you with the care you need, when you need it.  We recommend signing up for the patient portal called "MyChart".  Sign up information is provided on this After Visit Summary.  MyChart is used to connect with patients for Virtual Visits (Telemedicine).  Patients are able to view lab/test results, encounter notes, upcoming appointments, etc.  Non-urgent messages can be sent to your provider as well.   To learn more about what you can do with MyChart, go to [email or web address].      Other Instructions:  Pacemaker Implantation, Adult Pacemaker implantation is a procedure to place a pacemaker inside your chest. A pacemaker is a small computer that sends electrical signals to the heart and helps your heart beat normally. A pacemaker also stores information about your heart rhythms. You may need pacemaker implantation if you:  Have a slow heartbeat (bradycardia).  Faint  (syncope).  Have shortness of breath (dyspnea) due to heart problems. The pacemaker attaches to your heart through a wire, called a lead. Sometimes just one lead is needed. Other times, there will be two leads. There are two types of pacemakers:  Transvenous pacemaker. This type is placed under the skin or muscle of your chest. The lead goes through a vein in the chest area to reach the inside of the heart.  Epicardial pacemaker. This type is placed under the skin or muscle of your chest or belly. The lead goes through your chest to the outside of the heart. Tell a health care provider about:  Any allergies you have.  All medicines you are taking, including vitamins, herbs, eye drops, creams, and over-the-counter medicines.  Any problems you or family members have had with anesthetic medicines.  Any blood or bone disorders you have.  Any surgeries you have had.  Any medical conditions you have.  Whether you are pregnant or may be pregnant. What are the risks? Generally, this is a safe procedure. However, problems may occur, including:  Infection.  Bleeding.  Failure of the pacemaker or the lead.  Collapse of a lung or bleeding into a lung.  Blood clot inside a blood vessel with a lead.  Damage to the heart.  Infection inside the heart (endocarditis).  Allergic reactions to medicines. What happens before the procedure? Staying hydrated Follow instructions from your health care provider about hydration, which may include:  Up to 2 hours before the procedure - you may continue to drink clear liquids, such as water, clear fruit juice, black   coffee, and plain tea. Eating and drinking restrictions Follow instructions from your health care provider about eating and drinking, which may include:  8 hours before the procedure - stop eating heavy meals or foods such as meat, fried foods, or fatty foods.  6 hours before the procedure - stop eating light meals or foods, such as  toast or cereal.  6 hours before the procedure - stop drinking milk or drinks that contain milk.  2 hours before the procedure - stop drinking clear liquids. Medicines  Ask your health care provider about: ? Changing or stopping your regular medicines. This is especially important if you are taking diabetes medicines or blood thinners. ? Taking medicines such as aspirin and ibuprofen. These medicines can thin your blood. Do not take these medicines before your procedure if your health care provider instructs you not to.  You may be given antibiotic medicine to help prevent infection. General instructions  You will have a heart evaluation. This may include an electrocardiogram (ECG), chest X-ray, and heart imaging (echocardiogram,  or echo) tests.  You will have blood tests.  Do not use any products that contain nicotine or tobacco, such as cigarettes and e-cigarettes. If you need help quitting, ask your health care provider.  Plan to have someone take you home from the hospital or clinic.  If you will be going home right after the procedure, plan to have someone with you for 24 hours.  Ask your health care provider how your surgical site will be marked or identified. What happens during the procedure?  To reduce your risk of infection: ? Your health care team will wash or sanitize their hands. ? Your skin will be washed with soap. ? Hair may be removed from the surgical area.  An IV tube will be inserted into one of your veins.  You will be given one or more of the following: ? A medicine to help you relax (sedative). ? A medicine to numb the area (local anesthetic). ? A medicine to make you fall asleep (general anesthetic).  If you are getting a transvenous pacemaker: ? An incision will be made in your upper chest. ? A pocket will be made for the pacemaker. It may be placed under the skin or between layers of muscle. ? The lead will be inserted into a blood vessel that  returns to the heart. ? While X-rays are taken by an imaging machine (fluoroscopy), the lead will be advanced through the vein to the inside of your heart. ? The other end of the lead will be tunneled under the skin and attached to the pacemaker.  If you are getting an epicardial pacemaker: ? An incision will be made near your ribs or breastbone (sternum) for the lead. ? The lead will be attached to the outside of your heart. ? Another incision will be made in your chest or upper belly to create a pocket for the pacemaker. ? The free end of the lead will be tunneled under the skin and attached to the pacemaker.  The transvenous or epicardial pacemaker will be tested. Imaging studies may be done to check the lead position.  The incisions will be closed with stitches (sutures), adhesive strips, or skin glue.  Bandages (dressing) will be placed over the incisions. The procedure may vary among health care providers and hospitals. What happens after the procedure?  Your blood pressure, heart rate, breathing rate, and blood oxygen level will be monitored until the medicines you were given   have worn off.  You will be given antibiotics and pain medicine.  ECG and chest x-rays will be done.  You will wear a continuous type of ECG (Holter monitor) to check your heart rhythm.  Your health care provider will program the pacemaker.  Do not drive for 24 hours if you received a sedative. This information is not intended to replace advice given to you by your health care provider. Make sure you discuss any questions you have with your health care provider. Document Revised: 02/22/2018 Document Reviewed: 11/17/2015 Elsevier Patient Education  2020 [employer]   

## 2020-01-26 NOTE — H&P (View-Only) (Signed)
° °Electrophysiology Office Note ° ° °Date:  01/26/2020  ° °ID:  Douglas Salinas, DOB 01/11/1966, MRN [id number] ° °PCP:  Arvind, Moogali M, MD  °Cardiologist:  Dr Rosario °Primary Electrophysiologist: Jayanna Kroeger, MD   ° °CC: bradycardia °  °History of Present Illness: °Douglas Salinas is a 54 y.o. male who presents today for electrophysiology evaluation.   He is referred by Dr Rosario for EP evaluation of bradycardia.  He has familial spastic paraplegia syndrome as well as epilepsy.  He is in a wheelchair chronically.  He also had chronic pain with a morphine pain pump implanted. °He is followed by Dr Rosario for atypical chest pain.  myoview 4/21 was normal (personally reviewed). ° °He has had frequent (nearly daily) episodes of syncope/ falls.  He is a relatively poor historian.  He is accompanied by his mother who assists with history but has not been present for many of the episodes.  He reports having a prodrome of dizziness prior to transient LOC.  He does not think that episodes are due to seizures.  He notes that when he has prodrome of dizziness that frequently his heart rate is slow (40s).  He has frequent fatigue with his bradycardia.  He has been evaluated by Dr Rosario and found to have sinus bradycardia.  He had recent event monitor placed and is referred to me for further evaluation. ° °Today, he denies symptoms of palpitations, shortness of breath, or lower extremity edema.  He is in a wheelchair and does not drive currently.  The patient is tolerating medications without difficulties and is otherwise without complaint today.  ° ° °Past Medical History:  °Diagnosis Date  °• Abnormal stress test, stress myoview. with ischemia in the mid anterior and apical ant. wall 07/18/2011  °• Chest pain, not improved with Ranexa and Imdur 07/18/2011  °• Chronic back pain 07/18/2011  °• Chronic pain syndrome   °• DDD (degenerative disc disease), cervical   °• Depression   °• Depressive disorder   °• DM (diabetes  mellitus) (HCC)   °• Epilepsy (HCC) 07/18/2011  °• Familial spastic paraplegia syndrome (HCC)   ° in a wheel chair  °• GERD (gastroesophageal reflux disease) 07/18/2011  °• Headache   °• Hypertension   °• Hypokalemia   °• Hypomagnesemia   °• Internal hemorrhoids   °• Mixed hyperlipidemia   °• OSA on CPAP   °• Osteoporosis   °• Sleep apnea 07/18/2011  °• Vitamin D deficiency   ° °Past Surgical History:  °Procedure Laterality Date  °• APPENDECTOMY    °• HERNIA REPAIR    °• LEFT HEART CATHETERIZATION WITH CORONARY ANGIOGRAM N/A 07/18/2011  ° Procedure: LEFT HEART CATHETERIZATION WITH CORONARY ANGIOGRAM;  Surgeon: Jonathan J Berry, MD;  Location: [facility];  Service: Cardiovascular;  Laterality: N/A;  °• ORTHOPEDIC SURGERY    °• tendon repair    ° ° ° °Current Outpatient Medications  °Medication Sig Dispense Refill  °• amLODipine (NORVASC) 2.5 MG tablet Take 2.5 mg by mouth daily.    °• aspirin EC 81 MG tablet Take 81 mg by mouth daily.    °• atorvastatin (LIPITOR) 40 MG tablet Take 40 mg by mouth daily.    °• baclofen (LIORESAL) 20 MG tablet Take 20 mg by mouth 3 (three) times daily.    °• buprenorphine (BUTRANS) 15 MCG/HR Place onto the skin once a week.    °• calcium carbonate (OSCAL) 1500 (600 Ca) MG TABS tablet Take by mouth 2 (two) times daily   with a meal.    °• carbamazepine (TEGRETOL XR) 400 MG 12 hr tablet Take 400 mg by mouth 2 (two) times daily.    °• cholecalciferol (VITAMIN D3) 25 MCG (1000 UNIT) tablet Take 1,000 Units by mouth daily.    °• ciprofloxacin (CIPRO) 500 MG tablet Take 500 mg by mouth 2 (two) times daily.    °• DULoxetine (CYMBALTA) 30 MG capsule Take 30 mg by mouth at bedtime.    °• fentaNYL (DURAGESIC - DOSED MCG/HR) 75 MCG/HR Place 1 patch onto the skin every 3 (three) days. Placed new patch on 07/12/11 per patient    °• fluticasone (VERAMYST) 27.5 MCG/SPRAY nasal spray Place 2 sprays into the nose daily.    °• fluticasone furoate-vilanterol (BREO ELLIPTA) 100-25 MCG/INH AEPB Inhale 1 puff into  the lungs daily.    °• isosorbide mononitrate (IMDUR) 30 MG 24 hr tablet Take 30 mg by mouth daily.    °• levETIRAcetam (KEPPRA) 750 MG tablet Take 750 mg by mouth 2 (two) times daily.    °• loratadine (CLARITIN) 10 MG tablet Take 10 mg by mouth daily.    °• magnesium oxide (MAG-OX) 400 MG tablet Take 400 mg by mouth daily.    °• Multiple Vitamin (MULITIVITAMIN WITH MINERALS) TABS Take 1 tablet by mouth daily.    °• MULTIPLE VITAMIN PO Take by mouth.    °• naloxone (NARCAN) 4 MG/0.1ML LIQD nasal spray kit Place 1 spray into the nose.    °• nitroGLYCERIN (NITROSTAT) 0.4 MG SL tablet Place 0.4 mg under the tongue every 5 (five) minutes as needed. For chest pain    °• oxyCODONE-acetaminophen (PERCOCET) 5-325 MG per tablet Take 1 tablet by mouth 2 (two) times daily.    °• OXYGEN Inhale into the lungs.    °• pantoprazole (PROTONIX) 40 MG tablet Take 40 mg by mouth at bedtime.    °• PHENObarbital (LUMINAL) 30 MG tablet Take 30 mg by mouth.    °• Potassium Chloride Crys ER (KLOR-CON M20 PO) Take by mouth.    °• potassium chloride SA (KLOR-CON) 20 MEQ tablet Take 20 mEq by mouth 2 (two) times daily.    °• simvastatin (ZOCOR) 40 MG tablet Take 40 mg by mouth at bedtime.    ° °No current facility-administered medications for this visit.  ° ° °Allergies:   Codeine and Vicodin [hydrocodone-acetaminophen]  ° °Social History:  The patient  reports that he has never smoked. He has never used smokeless tobacco. He reports that he does not drink alcohol and does not use drugs.  ° °Family History:  The patient's family history includes Coronary artery disease in his father and mother; Diabetes in his mother; Hyperlipidemia in his brother, father, and mother.   Denies FH of arrhythmia, sudden death, or syncope ° ° °ROS:  Please see the history of present illness.   All other systems are personally reviewed and negative.  ° ° °PHYSICAL EXAM: °VS:  BP 110/70    Pulse (!) 46    Ht 6' 3" (1.905 m)    SpO2 93%    BMI 28.75 kg/m²  , BMI  Body mass index is 28.75 kg/m². °GEN: chronically ill,  in no acute distress, in a wheelchair today °Poor historian.  History assisted by caregiver (mother) who is with him today. °HEENT: normal °Neck: no JVD  °Cardiac:bradycardic rhythm °Respiratory:    normal work of breathing °GI: soft  °MS: atrophy in legs noted,  Paraplegic.  In a wheechair °Skin: warm and   dry  °Neuro:  Paraplegia noted °Psych: euthymic mood, full affect ° °EKG:  EKG is ordered today. °The ekg ordered today is personally reviewed and shows sinus bradycardia 46 bpm, PR 172 msec, QRS 90 msec, QTc 381 msec ° °   ° °Wt Readings from Last 3 Encounters:  °04/26/13 230 lb (104.3 kg)  °07/18/11 219 lb (99.3 kg)  °  ° °Other studies personally reviewed: °Additional studies/ records that were reviewed today include: over 50 pages from Dr Rosario's office is reviewed  °Review of the above records today demonstrates: as above ° ° °ASSESSMENT AND PLAN: ° °1.  Sick sinus syndrome with syncope °The patient has symptomatic sinus bradycardia. No reversible causes are found.   I would therefore recommend pacemaker implantation at this time.  Risks, benefits, alternatives to pacemaker implantation were discussed in detail with the patient today. The patient understands that the risks include but are not limited to bleeding, infection, pneumothorax, perforation, tamponade, vascular damage, renal failure, MI, stroke, death,  and lead dislodgement and wishes to proceed. We will therefore schedule the procedure at the next available time.  We also discussed remote monitoring and its role today. ° °No driving with syncope ° °Risks, benefits and potential toxicities for medications prescribed and/or refilled reviewed with patient today.  ° °  °Current medicines are reviewed at length with the patient today.   °The patient does not have concerns regarding his medicines.  The following changes were made today:  none ° °Labs/ tests ordered today include:  °Orders Placed  This Encounter  °Procedures  °• Basic metabolic panel  °• CBC  °• EKG 12-Lead  ° ° ° °Signed, °Fernand Sorbello, MD  °01/26/2020 10:04 AM    ° °[facility] °[address] °[address] °[place] [address] °[phone] (office) °[phone] (fax) ° ° °Addendum: °I spoke with Dr Rosario at length.  He reports echo 4/21 revealed normal ef, normal echo. °He also has reviewed the patients monitor and sees only sinus rhythm with sinus bradycardia,  No AV block or tachy arrhythmias are noted to explain syncope °I have asked that he fax these to me for review. ° °Maren Wiesen MD, FACC [facility] °01/26/2020 °4:00 PM ° ° °

## 2020-01-26 NOTE — Progress Notes (Signed)
Electrophysiology Office Note   Date:  01/26/2020   ID:  Douglas Salinas, DOB Nov 28, 1965, MRN [id number]  PCP:  Guadlupe Spanish, MD  Cardiologist:  Dr Claudie Leach Primary Electrophysiologist: Thompson Grayer, MD    CC: bradycardia   History of Present Illness: Douglas Salinas is a 54 y.o. male who presents today for electrophysiology evaluation.   He is referred by Dr Claudie Leach for EP evaluation of bradycardia.  He has familial spastic paraplegia syndrome as well as epilepsy.  He is in a wheelchair chronically.  He also had chronic pain with a morphine pain pump implanted. He is followed by Dr Claudie Leach for atypical chest pain.  Douglas Salinas 4/21 was normal (personally reviewed).  He has had frequent (nearly daily) episodes of syncope/ falls.  He is a relatively poor historian.  He is accompanied by his mother who assists with history but has not been present for many of the episodes.  He reports having a prodrome of dizziness prior to transient LOC.  He does not think that episodes are due to seizures.  He notes that when he has prodrome of dizziness that frequently his heart rate is slow (40s).  He has frequent fatigue with his bradycardia.  He has been evaluated by Dr Claudie Leach and found to have sinus bradycardia.  He had recent event monitor placed and is referred to me for further evaluation.  Today, he denies symptoms of palpitations, shortness of breath, or lower extremity edema.  He is in a wheelchair and does not drive currently.  The patient is tolerating medications without difficulties and is otherwise without complaint today.    Past Medical History:  Diagnosis Date   Abnormal stress test, stress myoview. with ischemia in the mid anterior and apical ant. wall 07/18/2011   Chest pain, not improved with Ranexa and Imdur 07/18/2011   Chronic back pain 07/18/2011   Chronic pain syndrome    DDD (degenerative disc disease), cervical    Depression    Depressive disorder    DM (diabetes  mellitus) ([place])    Epilepsy ([place]) 07/18/2011   Familial spastic paraplegia syndrome ([place])    in a wheel chair   GERD (gastroesophageal reflux disease) 07/18/2011   Headache    Hypertension    Hypokalemia    Hypomagnesemia    Internal hemorrhoids    Mixed hyperlipidemia    OSA on CPAP    Osteoporosis    Sleep apnea 07/18/2011   Vitamin D deficiency    Past Surgical History:  Procedure Laterality Date   APPENDECTOMY     HERNIA REPAIR     LEFT HEART CATHETERIZATION WITH CORONARY ANGIOGRAM N/A 07/18/2011   Procedure: LEFT HEART CATHETERIZATION WITH CORONARY ANGIOGRAM;  Surgeon: Lorretta Harp, MD;  Location: [facility] CATH LAB;  Service: Cardiovascular;  Laterality: N/A;   ORTHOPEDIC SURGERY     tendon repair       Current Outpatient Medications  Medication Sig Dispense Refill   amLODipine (NORVASC) 2.5 MG tablet Take 2.5 mg by mouth daily.     aspirin EC 81 MG tablet Take 81 mg by mouth daily.     atorvastatin (LIPITOR) 40 MG tablet Take 40 mg by mouth daily.     baclofen (LIORESAL) 20 MG tablet Take 20 mg by mouth 3 (three) times daily.     buprenorphine (BUTRANS) 15 MCG/HR Place onto the skin once a week.     calcium carbonate (OSCAL) 1500 (600 Ca) MG TABS tablet Take by mouth 2 (two)  with a meal.    °• carbamazepine (TEGRETOL XR) 400 MG 12 hr tablet Take 400 mg by mouth 2 (two) times daily.    °• cholecalciferol (VITAMIN D3) 25 MCG (1000 UNIT) tablet Take 1,000 Units by mouth daily.    °• ciprofloxacin (CIPRO) 500 MG tablet Take 500 mg by mouth 2 (two) times daily.    °• DULoxetine (CYMBALTA) 30 MG capsule Take 30 mg by mouth at bedtime.    °• fentaNYL (DURAGESIC - DOSED MCG/HR) 75 MCG/HR Place 1 patch onto the skin every 3 (three) days. Placed new patch on 07/12/11 per patient    °• fluticasone (VERAMYST) 27.5 MCG/SPRAY nasal spray Place 2 sprays into the nose daily.    °• fluticasone furoate-vilanterol (BREO ELLIPTA) 100-25 MCG/INH AEPB Inhale 1 puff into  the lungs daily.    °• isosorbide mononitrate (IMDUR) 30 MG 24 hr tablet Take 30 mg by mouth daily.    °• levETIRAcetam (KEPPRA) 750 MG tablet Take 750 mg by mouth 2 (two) times daily.    °• loratadine (CLARITIN) 10 MG tablet Take 10 mg by mouth daily.    °• magnesium oxide (MAG-OX) 400 MG tablet Take 400 mg by mouth daily.    °• Multiple Vitamin (MULITIVITAMIN WITH MINERALS) TABS Take 1 tablet by mouth daily.    °• MULTIPLE VITAMIN PO Take by mouth.    °• naloxone (NARCAN) 4 MG/0.1ML LIQD nasal spray kit Place 1 spray into the nose.    °• nitroGLYCERIN (NITROSTAT) 0.4 MG SL tablet Place 0.4 mg under the tongue every 5 (five) minutes as needed. For chest pain    °• oxyCODONE-acetaminophen (PERCOCET) 5-325 MG per tablet Take 1 tablet by mouth 2 (two) times daily.    °• OXYGEN Inhale into the lungs.    °• pantoprazole (PROTONIX) 40 MG tablet Take 40 mg by mouth at bedtime.    °• PHENObarbital (LUMINAL) 30 MG tablet Take 30 mg by mouth.    °• Potassium Chloride Crys ER (KLOR-CON M20 PO) Take by mouth.    °• potassium chloride SA (KLOR-CON) 20 MEQ tablet Take 20 mEq by mouth 2 (two) times daily.    °• simvastatin (ZOCOR) 40 MG tablet Take 40 mg by mouth at bedtime.    ° °No current facility-administered medications for this visit.  ° ° °Allergies:   Codeine and Vicodin [hydrocodone-acetaminophen]  ° °Social History:  The patient  reports that he has never smoked. He has never used smokeless tobacco. He reports that he does not drink alcohol and does not use drugs.  ° °Family History:  The patient's family history includes Coronary artery disease in his father and mother; Diabetes in his mother; Hyperlipidemia in his brother, father, and mother.   Denies FH of arrhythmia, sudden death, or syncope ° ° °ROS:  Please see the history of present illness.   All other systems are personally reviewed and negative.  ° ° °PHYSICAL EXAM: °VS:  BP 110/70    Pulse (!) 46    Ht 6' 3" (1.905 m)    SpO2 93%    BMI 28.75 kg/m²  , BMI  Body mass index is 28.75 kg/m². °GEN: chronically ill,  in no acute distress, in a wheelchair today °Poor historian.  History assisted by caregiver (mother) who is with him today. °HEENT: normal °Neck: no JVD  °Cardiac:bradycardic rhythm °Respiratory:    normal work of breathing °GI: soft  °MS: atrophy in legs noted,  Paraplegic.  In a wheechair °Skin: warm and   warm and dry  Neuro:  Paraplegia noted Psych: euthymic mood, full affect  EKG:  EKG is ordered today. The ekg ordered today is personally reviewed and shows sinus bradycardia 46 bpm, PR 172 msec, QRS 90 msec, QTc 381 msec      Wt Readings from Last 3 Encounters:  04/26/13 230 lb (104.3 kg)  07/18/11 219 lb (99.3 kg)     Other studies personally reviewed: Additional studies/ records that were reviewed today include: over 65 pages from Dr Rosario's office is reviewed  Review of the above records today demonstrates: as above   ASSESSMENT AND PLAN:  1.  Sick sinus syndrome with syncope The patient has symptomatic sinus bradycardia. No reversible causes are found.   I would therefore recommend pacemaker implantation at this time.  Risks, benefits, alternatives to pacemaker implantation were discussed in detail with the patient today. The patient understands that the risks include but are not limited to bleeding, infection, pneumothorax, perforation, tamponade, vascular damage, renal failure, MI, stroke, death,  and lead dislodgement and wishes to proceed. We will therefore schedule the procedure at the next available time.  We also discussed remote monitoring and its role today.  No driving with syncope  Risks, benefits and potential toxicities for medications prescribed and/or refilled reviewed with patient today.     Current medicines are reviewed at length with the patient today.   The patient does not have concerns regarding his medicines.  The following changes were made today:  none  Labs/ tests ordered today include:  Orders Placed  This Encounter  Procedures   Basic metabolic panel   CBC   EKG 12-Lead     Signed, Thompson Grayer, MD  01/26/2020 10:04 AM     [facility] [address] [place] [address] [phone] (office) [phone] (fax)   Addendum: I spoke with Dr Claudie Leach at length.  He reports echo 4/21 revealed normal ef, normal echo. He also has reviewed the patients monitor and sees only sinus rhythm with sinus bradycardia,  No AV block or tachy arrhythmias are noted to explain syncope I have asked that he fax these to me for review.  Thompson Grayer MD, [facility] 01/26/2020 4:00 PM

## 2020-01-28 ENCOUNTER — Telehealth: Payer: Self-pay | Admitting: Internal Medicine

## 2020-01-28 NOTE — Progress Notes (Signed)
Attempted to call patient regarding procedure scheduled for tomorrow.  Left voice mail nothing to eat or drink after midnight, No meds Am of procedure.  Responsible person to drive you home and stay with you for 24 hrs, Wash with special soap night before and morning of procedure

## 2020-01-28 NOTE — Telephone Encounter (Signed)
Patient states his mom will be accompanying him during his PPM Implant scheduled for 01/29/20 with Dr. Johney Frame. However, his mom also requires wheelchair assistance and he would like for his cousin to come to assist her. Made patient aware that we are only allowing 1 visitor to accompany patients, but he insisted that that his cousin needs to come as well. Please call to advise.

## 2020-01-29 ENCOUNTER — Ambulatory Visit (HOSPITAL_COMMUNITY)
Admission: RE | Disposition: A | Discharge status: Home / Self Care | Payer: Self-pay | Source: Home / Self Care | Attending: Internal Medicine

## 2020-01-29 ENCOUNTER — Ambulatory Visit (HOSPITAL_COMMUNITY): Payer: Medicare Other

## 2020-01-29 ENCOUNTER — Ambulatory Visit (HOSPITAL_COMMUNITY)
Admission: RE | Admit: 2020-01-29 | Discharge: 2020-01-29 | Disposition: A | Discharge status: Home / Self Care | Payer: Medicare Other | Attending: Internal Medicine | Admitting: Internal Medicine

## 2020-01-29 DIAGNOSIS — G40909 Epilepsy, unspecified, not intractable, without status epilepticus: Secondary | ICD-10-CM | POA: Insufficient documentation

## 2020-01-29 DIAGNOSIS — Z79899 Other long term (current) drug therapy: Secondary | ICD-10-CM | POA: Insufficient documentation

## 2020-01-29 DIAGNOSIS — Z7951 Long term (current) use of inhaled steroids: Secondary | ICD-10-CM | POA: Diagnosis not present

## 2020-01-29 DIAGNOSIS — M810 Age-related osteoporosis without current pathological fracture: Secondary | ICD-10-CM | POA: Diagnosis not present

## 2020-01-29 DIAGNOSIS — G114 Hereditary spastic paraplegia: Secondary | ICD-10-CM | POA: Diagnosis not present

## 2020-01-29 DIAGNOSIS — Z7982 Long term (current) use of aspirin: Secondary | ICD-10-CM | POA: Diagnosis not present

## 2020-01-29 DIAGNOSIS — E119 Type 2 diabetes mellitus without complications: Secondary | ICD-10-CM | POA: Insufficient documentation

## 2020-01-29 DIAGNOSIS — I495 Sick sinus syndrome: Secondary | ICD-10-CM | POA: Diagnosis not present

## 2020-01-29 DIAGNOSIS — G4733 Obstructive sleep apnea (adult) (pediatric): Secondary | ICD-10-CM | POA: Diagnosis not present

## 2020-01-29 DIAGNOSIS — Z885 Allergy status to narcotic agent status: Secondary | ICD-10-CM | POA: Insufficient documentation

## 2020-01-29 DIAGNOSIS — I10 Essential (primary) hypertension: Secondary | ICD-10-CM | POA: Diagnosis not present

## 2020-01-29 DIAGNOSIS — Z950 Presence of cardiac pacemaker: Secondary | ICD-10-CM

## 2020-01-29 DIAGNOSIS — K219 Gastro-esophageal reflux disease without esophagitis: Secondary | ICD-10-CM | POA: Insufficient documentation

## 2020-01-29 DIAGNOSIS — E782 Mixed hyperlipidemia: Secondary | ICD-10-CM | POA: Insufficient documentation

## 2020-01-29 DIAGNOSIS — F329 Major depressive disorder, single episode, unspecified: Secondary | ICD-10-CM | POA: Insufficient documentation

## 2020-01-29 DIAGNOSIS — E559 Vitamin D deficiency, unspecified: Secondary | ICD-10-CM | POA: Diagnosis not present

## 2020-01-29 DIAGNOSIS — G894 Chronic pain syndrome: Secondary | ICD-10-CM | POA: Diagnosis not present

## 2020-01-29 DIAGNOSIS — R55 Syncope and collapse: Secondary | ICD-10-CM | POA: Diagnosis not present

## 2020-01-29 DIAGNOSIS — Z993 Dependence on wheelchair: Secondary | ICD-10-CM | POA: Insufficient documentation

## 2020-01-29 HISTORY — PX: PACEMAKER IMPLANT: EP1218

## 2020-01-29 LAB — GLUCOSE, CAPILLARY: Glucose-Capillary: 101 mg/dL — ABNORMAL HIGH (ref 70–99)

## 2020-01-29 SURGERY — PACEMAKER IMPLANT
Anesthesia: LOCAL

## 2020-01-29 MED ORDER — FENTANYL CITRATE (PF) 100 MCG/2ML IJ SOLN
INTRAMUSCULAR | Status: AC
  Filled 2020-01-29: qty 2

## 2020-01-29 MED ORDER — MIDAZOLAM HCL 5 MG/5ML IJ SOLN
INTRAMUSCULAR | Status: AC
  Filled 2020-01-29: qty 5

## 2020-01-29 MED ORDER — CEFAZOLIN SODIUM-DEXTROSE 2-4 GM/100ML-% IV SOLN
INTRAVENOUS | Status: AC
  Filled 2020-01-29: qty 100

## 2020-01-29 MED ORDER — ONDANSETRON HCL 4 MG/2ML IJ SOLN: 4.0000 mg | Freq: Four times a day (QID) | INTRAMUSCULAR | Status: DC | PRN

## 2020-01-29 MED ORDER — LIDOCAINE HCL 1 % IJ SOLN
INTRAMUSCULAR | Status: AC
  Filled 2020-01-29: qty 60

## 2020-01-29 MED ORDER — SODIUM CHLORIDE 0.9 % IV SOLN: INTRAVENOUS | Status: DC

## 2020-01-29 MED ORDER — MIDAZOLAM HCL 5 MG/5ML IJ SOLN
INTRAMUSCULAR | Status: DC | PRN
  Administered 2020-01-29: 0.5 mg via INTRAVENOUS

## 2020-01-29 MED ORDER — HEPARIN (PORCINE) IN NACL 1000-0.9 UT/500ML-% IV SOLN
INTRAVENOUS | Status: AC
  Filled 2020-01-29: qty 500

## 2020-01-29 MED ORDER — CHLORHEXIDINE GLUCONATE 4 % EX LIQD
4.0000 "application " | Freq: Once | CUTANEOUS | Status: DC
  Filled 2020-01-29: qty 60

## 2020-01-29 MED ORDER — LIDOCAINE HCL 1 % IJ SOLN
INTRAMUSCULAR | Status: AC
  Filled 2020-01-29: qty 20

## 2020-01-29 MED ORDER — LIDOCAINE HCL (PF) 1 % IJ SOLN
INTRAMUSCULAR | Status: DC | PRN
  Administered 2020-01-29: 50 mL

## 2020-01-29 MED ORDER — SODIUM CHLORIDE 0.9 % IV SOLN
80.0000 mg | INTRAVENOUS | Status: AC
  Administered 2020-01-29: 80 mg
  Filled 2020-01-29: qty 2

## 2020-01-29 MED ORDER — ACETAMINOPHEN 325 MG PO TABS: 325.0000 mg | ORAL_TABLET | ORAL | Status: DC | PRN

## 2020-01-29 MED ORDER — HEPARIN (PORCINE) IN NACL 1000-0.9 UT/500ML-% IV SOLN
INTRAVENOUS | Status: DC | PRN
  Administered 2020-01-29: 500 mL

## 2020-01-29 MED ORDER — SODIUM CHLORIDE 0.9 % IV SOLN
INTRAVENOUS | Status: AC
  Filled 2020-01-29: qty 2

## 2020-01-29 MED ORDER — CEFAZOLIN SODIUM-DEXTROSE 2-4 GM/100ML-% IV SOLN
2.0000 g | INTRAVENOUS | Status: AC
  Administered 2020-01-29: 2 g via INTRAVENOUS
  Filled 2020-01-29: qty 100

## 2020-01-29 SURGICAL SUPPLY — 8 items
CABLE SURGICAL S-101-97-12 (CABLE) ×2 IMPLANT
LEAD TENDRIL MRI 52CM LPA1200M (Lead) ×2 IMPLANT
LEAD TENDRIL MRI 58CM LPA1200M (Lead) ×2 IMPLANT
PACEMAKER ASSURITY DR-RF (Pacemaker) ×2 IMPLANT
PAD PRO RADIOLUCENT 2001M-C (PAD) ×2 IMPLANT
SHEATH 8FR PRELUDE SNAP 13 (SHEATH) ×4 IMPLANT
SHEATH PROBE COVER 6X72 (BAG) ×2 IMPLANT
TRAY PACEMAKER INSERTION (PACKS) ×2 IMPLANT

## 2020-01-29 NOTE — Telephone Encounter (Signed)
Left message to call back regarding hospital vistiors

## 2020-01-29 NOTE — Discharge Instructions (Signed)
After Your Pacemaker   . You have a [employer] Pacemaker  ACTIVITY . Do not lift your arm above shoulder height for 1 week after your procedure. After 7 days, you may progress as below.     Thursday February 05, 2020  Friday February 06, 2020 Saturday February 07, 2020 Sunday February 08, 2020   . Do not lift, push, pull, or carry anything over 10 pounds with the affected arm until 6 weeks (Thursday March 11, 2020 ) after your procedure.   . Do NOT DRIVE until you have been seen for your wound check, or as long as instructed by your healthcare provider.   . Ask your healthcare provider when you can go back to work   INCISION/Dressing . If you are on a blood thinner such as Coumadin, Xarelto, Eliquis, Plavix, or Pradaxa please confirm with your provider when this should be resumed.   . Monitor your Pacemaker site for redness, swelling, and drainage. Call the device clinic at [phone] if you experience these symptoms or fever/chills.  . If your incision is sealed with Steri-strips or staples, you may shower 10 days after your procedure or when told by your provider. Do not remove the steri-strips or let the shower hit directly on your site. You may wash around your site with soap and water.    Marland Kitchen Avoid lotions, ointments, or perfumes over your incision until it is well-healed.  . You may use a hot tub or a pool AFTER your wound check appointment if the incision is completely closed.  Marland Kitchen PAcemaker Alerts:  Some alerts are vibratory and others beep. These are NOT emergencies. Please call our office to let us know. If this occurs at night or on weekends, it can wait until the next business day. Send a remote transmission.  . If your device is capable of reading fluid status (for heart failure), you will be offered monthly monitoring to review this with you.   DEVICE MANAGEMENT . Remote monitoring is used to monitor your pacemaker from home. This monitoring is scheduled every 91 days by our  office. It allows [place] to keep an eye on the functioning of your device to ensure it is working properly. You will routinely see your Electrophysiologist annually (more often if necessary).   . You should receive your ID card for your new device in 4-8 weeks. Keep this card with you at all times once received. Consider wearing a medical alert bracelet or necklace.  . Your Pacemaker may be MRI compatible. This will be discussed at your next office visit/wound check.  You should avoid contact with strong electric or magnetic fields.    Do not use amateur (ham) radio equipment or electric (arc) welding torches. MP3 player headphones with magnets should not be used. Some devices are safe to use if held at least 12 inches (30 cm) from your Pacemaker. These include power tools, lawn mowers, and speakers. If you are unsure if something is safe to use, ask your health care provider.   When using your cell phone, hold it to the ear that is on the opposite side from the Pacemaker. Do not leave your cell phone in a pocket over the Pacemaker.   You may safely use electric blankets, heating pads, computers, and microwave ovens.  Call the office right away if:  You have chest pain.  You feel more short of breath than you have felt before.  You feel more light-headed than you have felt before.  Your incision starts to open up.  This information is not intended to replace advice given to you by your health care provider. Make sure you discuss any questions you have with your health care provider.

## 2020-01-29 NOTE — Interval H&P Note (Signed)
History and Physical Interval Note:  01/29/2020 7:26 AM  Douglas Salinas  has presented today for surgery, with the diagnosis of Bradycardia.  The various methods of treatment have been discussed with the patient and family. After consideration of risks, benefits and other options for treatment, the patient has consented to  Procedure(s): PACEMAKER IMPLANT (N/A) as a surgical intervention.  The patient's history has been reviewed, patient examined, no change in status, stable for surgery.  I have reviewed the patient's chart and labs.  Questions were answered to the patient's satisfaction.     Lanier Prude

## 2020-01-29 NOTE — Progress Notes (Signed)
Discharge instructions reviewed by previous nurse with patient and family. Verbalized understanding.

## 2020-01-30 ENCOUNTER — Encounter (HOSPITAL_COMMUNITY): Payer: Self-pay | Admitting: Cardiology

## 2020-01-30 MED FILL — Fentanyl Citrate Preservative Free (PF) Inj 100 MCG/2ML: INTRAMUSCULAR | Qty: 2 | Status: AC

## 2020-01-30 MED FILL — Lidocaine HCl Local Inj 1%: INTRAMUSCULAR | Qty: 60 | Status: AC

## 2020-01-30 MED FILL — Lidocaine HCl Local Inj 1%: INTRAMUSCULAR | Qty: 20 | Status: AC

## 2020-02-10 ENCOUNTER — Ambulatory Visit (INDEPENDENT_AMBULATORY_CARE_PROVIDER_SITE_OTHER): Payer: Medicare Other | Admitting: Emergency Medicine

## 2020-02-10 ENCOUNTER — Other Ambulatory Visit: Payer: Self-pay

## 2020-02-10 DIAGNOSIS — I495 Sick sinus syndrome: Secondary | ICD-10-CM | POA: Diagnosis not present

## 2020-02-10 DIAGNOSIS — Z950 Presence of cardiac pacemaker: Secondary | ICD-10-CM

## 2020-02-10 NOTE — Patient Instructions (Signed)
Call the device clinic if the incision site has increased swelling, any drainage , or any bleeding. Call the office if you develop a fever or chills.

## 2020-02-11 LAB — CUP PACEART INCLINIC DEVICE CHECK
Battery Remaining Longevity: 81 mo
Battery Voltage: 3.05 V
Brady Statistic RA Percent Paced: 94 %
Brady Statistic RV Percent Paced: 4.1 %
Date Time Interrogation Session: 20210824121700
Implantable Lead Implant Date: 20210812
Implantable Lead Implant Date: 20210812
Implantable Lead Location: 753859
Implantable Lead Location: 753860
Implantable Pulse Generator Implant Date: 20210812
Lead Channel Impedance Value: 487.5 Ohm
Lead Channel Impedance Value: 550 Ohm
Lead Channel Pacing Threshold Amplitude: 0.5 V
Lead Channel Pacing Threshold Amplitude: 0.75 V
Lead Channel Pacing Threshold Pulse Width: 0.5 ms
Lead Channel Pacing Threshold Pulse Width: 0.5 ms
Lead Channel Sensing Intrinsic Amplitude: 5 mV
Lead Channel Sensing Intrinsic Amplitude: 8.4 mV
Lead Channel Setting Pacing Amplitude: 3.5 V
Lead Channel Setting Pacing Amplitude: 3.5 V
Lead Channel Setting Pacing Pulse Width: 0.5 ms
Lead Channel Setting Sensing Sensitivity: 2 mV
Pulse Gen Model: 2272
Pulse Gen Serial Number: 3852532

## 2020-02-11 NOTE — Progress Notes (Signed)
Wound check appointment. Steri-strips removed. Wound without redness or edema. Incision edges approximated, wound well healed. Normal device function. Thresholds, sensing, and impedances consistent with implant measurements. Device programmed at 3.5V/auto capture programmed on for extra safety margin until 3 month visit. Histogram distribution appropriate for patient and level of activity. No mode switches or high ventricular rates noted. 1 episode of noise recorded that occurred during implant. Patient educated about wound care, arm mobility, lifting restrictions. ROV with Dr Johney Frame 05/10/20.  Enrolled in remote follow-up and next remote 04/29/20.

## 2020-02-13 ENCOUNTER — Institutional Professional Consult (permissible substitution): Payer: Self-pay | Admitting: Internal Medicine

## 2020-04-29 ENCOUNTER — Ambulatory Visit (INDEPENDENT_AMBULATORY_CARE_PROVIDER_SITE_OTHER): Payer: Medicare Other

## 2020-04-29 DIAGNOSIS — I495 Sick sinus syndrome: Secondary | ICD-10-CM

## 2020-04-29 LAB — CUP PACEART REMOTE DEVICE CHECK
Battery Remaining Longevity: 70 mo
Battery Remaining Percentage: 95.5 %
Battery Voltage: 3.01 V
Brady Statistic AP VP Percent: 1 %
Brady Statistic AP VS Percent: 83 %
Brady Statistic AS VP Percent: 7.6 %
Brady Statistic AS VS Percent: 8.3 %
Brady Statistic RA Percent Paced: 84 %
Brady Statistic RV Percent Paced: 8.2 %
Date Time Interrogation Session: 20211111020030
Implantable Lead Implant Date: 20210812
Implantable Lead Implant Date: 20210812
Implantable Lead Location: 753859
Implantable Lead Location: 753860
Implantable Pulse Generator Implant Date: 20210812
Lead Channel Impedance Value: 480 Ohm
Lead Channel Impedance Value: 480 Ohm
Lead Channel Pacing Threshold Amplitude: 0.5 V
Lead Channel Pacing Threshold Amplitude: 0.75 V
Lead Channel Pacing Threshold Pulse Width: 0.5 ms
Lead Channel Pacing Threshold Pulse Width: 0.5 ms
Lead Channel Sensing Intrinsic Amplitude: 5 mV
Lead Channel Sensing Intrinsic Amplitude: 9.5 mV
Lead Channel Setting Pacing Amplitude: 3.5 V
Lead Channel Setting Pacing Amplitude: 3.5 V
Lead Channel Setting Pacing Pulse Width: 0.5 ms
Lead Channel Setting Sensing Sensitivity: 2 mV
Pulse Gen Model: 2272
Pulse Gen Serial Number: 3852532

## 2020-05-03 NOTE — Progress Notes (Signed)
Remote pacemaker transmission.   

## 2020-05-10 ENCOUNTER — Other Ambulatory Visit: Payer: Self-pay

## 2020-05-10 ENCOUNTER — Ambulatory Visit (INDEPENDENT_AMBULATORY_CARE_PROVIDER_SITE_OTHER): Payer: Medicare Other | Admitting: Internal Medicine

## 2020-05-10 ENCOUNTER — Encounter: Payer: Self-pay | Admitting: Internal Medicine

## 2020-05-10 VITALS — BP 140/84 | HR 60 | Ht 75.0 in | Wt 185.0 lb

## 2020-05-10 DIAGNOSIS — I495 Sick sinus syndrome: Secondary | ICD-10-CM | POA: Diagnosis not present

## 2020-05-10 DIAGNOSIS — R001 Bradycardia, unspecified: Secondary | ICD-10-CM

## 2020-05-10 LAB — CUP PACEART INCLINIC DEVICE CHECK
Battery Remaining Longevity: 117 mo
Battery Voltage: 3.01 V
Brady Statistic RA Percent Paced: 84 %
Brady Statistic RV Percent Paced: 7.9 %
Date Time Interrogation Session: 20211122162904
Implantable Lead Implant Date: 20210812
Implantable Lead Implant Date: 20210812
Implantable Lead Location: 753859
Implantable Lead Location: 753860
Implantable Pulse Generator Implant Date: 20210812
Lead Channel Impedance Value: 512.5 Ohm
Lead Channel Impedance Value: 537.5 Ohm
Lead Channel Pacing Threshold Amplitude: 0.75 V
Lead Channel Pacing Threshold Amplitude: 0.75 V
Lead Channel Pacing Threshold Pulse Width: 0.5 ms
Lead Channel Pacing Threshold Pulse Width: 0.5 ms
Lead Channel Sensing Intrinsic Amplitude: 4 mV
Lead Channel Sensing Intrinsic Amplitude: 9.1 mV
Lead Channel Setting Pacing Amplitude: 2 V
Lead Channel Setting Pacing Amplitude: 2.5 V
Lead Channel Setting Pacing Pulse Width: 0.5 ms
Lead Channel Setting Sensing Sensitivity: 2 mV
Pulse Gen Model: 2272
Pulse Gen Serial Number: 3852532

## 2020-05-10 NOTE — Patient Instructions (Addendum)
Medication Instructions:  Your physician recommends that you continue on your current medications as directed. Please refer to the Current Medication list given to you today.  *If you need a refill on your cardiac medications before your next appointment, please call your pharmacy*  Lab Work: None ordered.  If you have labs (blood work) drawn today and your tests are completely normal, you will receive your results only by: Marland Kitchen MyChart Message (if you have MyChart) OR . A paper copy in the mail If you have any lab test that is abnormal or we need to change your treatment, we will call you to review the results.  Testing/Procedures: None ordered.  Follow-Up: At [facility], you and your health needs are our priority.  As part of our continuing mission to provide you with exceptional heart care, we have created designated Provider Care Teams.  These Care Teams include your primary Cardiologist (physician) and Advanced Practice Providers (APPs -  Physician Assistants and Nurse Practitioners) who all work together to provide you with the care you need, when you need it.  We recommend signing up for the patient portal called "MyChart".  Sign up information is provided on this After Visit Summary.  MyChart is used to connect with patients for Virtual Visits (Telemedicine).  Patients are able to view lab/test results, encounter notes, upcoming appointments, etc.  Non-urgent messages can be sent to your provider as well.   To learn more about what you can do with MyChart, go to [email or web address].    Your next appointment:   Your physician wants you to follow-up in: 1 year with Douglas Salinas. You will receive a reminder letter in the mail two months in advance. If you don't receive a letter, please call our office to schedule the follow-up appointment.  Remote monitoring is used to monitor your Pacemaker from home. This monitoring reduces the number of office visits required to check your device  to one time per year. It allows [place] to keep an eye on the functioning of your device to ensure it is working properly. You are scheduled for a device check from home on 07/29/20. You may send your transmission at any time that day. If you have a wireless device, the transmission will be sent automatically. After your physician reviews your transmission, you will receive a postcard with your next transmission date.  Other Instructions:

## 2020-05-10 NOTE — Progress Notes (Signed)
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  Since his PPM implant, the patient reports doing very well.  He is chronically ill and not very active.  he is in a wheelchair today.  When he presented for his PPM implant, he was heavily sedated (home medicines).  I suspect that narcotics may be partially responsible for his prior falls and "blackouts".    Past Medical History:  Diagnosis Date  . Abnormal stress test, stress myoview. with ischemia in the mid anterior and apical ant. wall 07/18/2011  . Chest pain, not improved with Ranexa and Imdur 07/18/2011  . Chronic back pain 07/18/2011  . Chronic pain syndrome   . DDD (degenerative disc disease), cervical   . Depression   . Depressive disorder   . DM (diabetes mellitus) ([facility])   . Epilepsy ([facility]) 07/18/2011  . Familial spastic paraplegia syndrome ([facility])    in a wheel chair  . GERD (gastroesophageal reflux disease) 07/18/2011  . Headache   . Hypertension   . Hypokalemia   . Hypomagnesemia   . Internal hemorrhoids   . Mixed hyperlipidemia   . OSA on CPAP   . Osteoporosis   . Sleep apnea 07/18/2011  . Vitamin D deficiency    Past Surgical History:  Procedure Laterality Date  . APPENDECTOMY    . HERNIA REPAIR    . LEFT HEART CATHETERIZATION WITH CORONARY ANGIOGRAM N/A 07/18/2011   Procedure: LEFT HEART CATHETERIZATION WITH CORONARY ANGIOGRAM;  Surgeon: Runell Gess, MD;  Location: [facility] CATH LAB;  Service: Cardiovascular;  Laterality: N/A;  . ORTHOPEDIC SURGERY    . PACEMAKER IMPLANT N/A 01/29/2020   Procedure: PACEMAKER IMPLANT;  Surgeon: Lanier Prude, MD;  Location[facility] INVASIVE CV LAB;  Service: Cardiovascular;  Laterality: N/A;  . tendon repair      ROS- all systems are reviewed and negative except as per HPI above  Current Outpatient Medications  Medication Sig Dispense Refill  . aspirin  EC 81 MG tablet Take 81 mg by mouth daily.    Marland Kitchen atorvastatin (LIPITOR) 40 MG tablet Take 40 mg by mouth daily.    . baclofen (LIORESAL) 20 MG tablet Take 20 mg by mouth 3 (three) times daily.    . buprenorphine (BUTRANS) 15 MCG/HR Place 1 patch onto the skin every Thursday.     . calcium carbonate (OSCAL) 1500 (600 Ca) MG TABS tablet Take 600 mg of elemental calcium by mouth 2 (two) times daily with a meal.     . carbamazepine (TEGRETOL XR) 400 MG 12 hr tablet Take 400 mg by mouth 2 (two) times daily.    . cholecalciferol (VITAMIN D3) 25 MCG (1000 UNIT) tablet Take 1,000 Units by mouth daily.    . fluticasone (VERAMYST) 27.5 MCG/SPRAY nasal spray Place 2 sprays into the nose daily.    . fluticasone furoate-vilanterol (BREO ELLIPTA) 100-25 MCG/INH AEPB Inhale 1 puff into the lungs daily.    . isosorbide mononitrate (IMDUR) 30 MG 24 hr tablet Take 30 mg by mouth daily.    Marland Kitchen levETIRAcetam (KEPPRA) 750 MG tablet Take 750 mg by mouth 2 (two) times daily.    Marland Kitchen loratadine (CLARITIN) 10 MG tablet Take 10 mg by mouth daily as needed for allergies.     . magnesium oxide (MAG-OX) 400 MG tablet Take 400 mg by mouth daily.    . Multiple Vitamin (  MULITIVITAMIN WITH MINERALS) TABS Take 1 tablet by mouth daily.    Marland Kitchen oxyCODONE-acetaminophen (PERCOCET) 5-325 MG per tablet Take 1 tablet by mouth at bedtime.     . OXYGEN Inhale 2 L into the lungs at bedtime.     . pantoprazole (PROTONIX) 40 MG tablet Take 40 mg by mouth at bedtime.    Marland Kitchen PHENObarbital (LUMINAL) 30 MG tablet Take 30 mg by mouth at bedtime.     . potassium chloride SA (KLOR-CON M20) 20 MEQ tablet Take 20 mEq by mouth daily.      No current facility-administered medications for this visit.    Physical Exam: Vitals:   05/10/20 1426  BP: 140/84  Pulse: 60  SpO2: 95%  Weight: 185 lb (83.9 kg)  Height: 6\' 3"  (1.905 m)    GEN- The patient is chronically ill appearing, alert and oriented x 3 today.   Head- normocephalic, atraumatic Eyes-   Sclera clear, conjunctiva pink Ears- hearing intact Oropharynx clear Lungs-  normal work of breathing Chest- pacemaker pocket is well healed Heart- Regular rate and rhythm  GI- soft, NT, ND, + BS Extremities- no clubbing, cyanosis, or edema  Pacemaker interrogation- reviewed in detail today,  See PACEART report  ekg tracing ordered today is personally reviewed and shows atrial paced rhythm  Assessment and Plan:  1. Symptomatic sinus bradycardia  Normal pacemaker function See Pace Art report No changes today he is not device dependant today  2. Syncope Caution with narcotics (as above)  Risks, benefits and potential toxicities for medications prescribed and/or refilled reviewed with patient today.   Return annually to see EP PA I will see when needed.  MD, [facility] 05/10/2020 2:40 PM

## 2020-07-08 ENCOUNTER — Telehealth: Payer: Self-pay | Admitting: Emergency Medicine

## 2020-07-08 NOTE — Telephone Encounter (Signed)
Alert for episodes binned as SVT and atrial tachycardia 07/07/20 at 1152 and 1157 that appear to be SVT, the longest episode lasting 1 hour 57 minutes. Patient reports he was checking his wheel chair ramp for ice during the time of the events and wheeled himself out on the porch and returned into the house. He reports BUE pain and chest pain with movement after returning inside, no SOB, nausea, vomiting, diaphoresis, or jaw pain . The chest pain resolved with rest and the BUE pain was improved by using a heating pad on BUE. Patient reports BUE pain started after he had 2 falls out of his wheelchair over the past 5 days. Recommended he follow-up with his PCP for BUE pain and ED precautions given for CP, SOB, chest pressure, or worsening cardiac condition .

## 2020-07-29 ENCOUNTER — Ambulatory Visit (INDEPENDENT_AMBULATORY_CARE_PROVIDER_SITE_OTHER): Payer: Medicare Other

## 2020-07-29 DIAGNOSIS — I495 Sick sinus syndrome: Secondary | ICD-10-CM

## 2020-07-29 LAB — CUP PACEART REMOTE DEVICE CHECK
Battery Remaining Longevity: 114 mo
Battery Remaining Percentage: 95.5 %
Battery Voltage: 3.02 V
Brady Statistic AP VP Percent: 1.1 %
Brady Statistic AP VS Percent: 87 %
Brady Statistic AS VP Percent: 1 %
Brady Statistic AS VS Percent: 11 %
Brady Statistic RA Percent Paced: 87 %
Brady Statistic RV Percent Paced: 1.9 %
Date Time Interrogation Session: 20220210020015
Implantable Lead Implant Date: 20210812
Implantable Lead Implant Date: 20210812
Implantable Lead Location: 753859
Implantable Lead Location: 753860
Implantable Pulse Generator Implant Date: 20210812
Lead Channel Impedance Value: 540 Ohm
Lead Channel Impedance Value: 550 Ohm
Lead Channel Pacing Threshold Amplitude: 0.75 V
Lead Channel Pacing Threshold Amplitude: 0.75 V
Lead Channel Pacing Threshold Pulse Width: 0.5 ms
Lead Channel Pacing Threshold Pulse Width: 0.5 ms
Lead Channel Sensing Intrinsic Amplitude: 12 mV
Lead Channel Sensing Intrinsic Amplitude: 3.1 mV
Lead Channel Setting Pacing Amplitude: 2 V
Lead Channel Setting Pacing Amplitude: 2.5 V
Lead Channel Setting Pacing Pulse Width: 0.5 ms
Lead Channel Setting Sensing Sensitivity: 2 mV
Pulse Gen Model: 2272
Pulse Gen Serial Number: 3852532

## 2020-08-04 NOTE — Progress Notes (Signed)
Remote pacemaker transmission.   

## 2020-08-08 LAB — COLOGUARD

## 2020-08-08 LAB — EXTERNAL GENERIC LAB PROCEDURE

## 2020-09-01 LAB — COLOGUARD: COLOGUARD: NEGATIVE

## 2020-09-01 LAB — EXTERNAL GENERIC LAB PROCEDURE: COLOGUARD: NEGATIVE

## 2020-10-28 ENCOUNTER — Ambulatory Visit (INDEPENDENT_AMBULATORY_CARE_PROVIDER_SITE_OTHER): Payer: Medicare Other

## 2020-10-28 DIAGNOSIS — I495 Sick sinus syndrome: Secondary | ICD-10-CM

## 2020-10-28 LAB — CUP PACEART REMOTE DEVICE CHECK
Battery Remaining Longevity: 110 mo
Battery Remaining Percentage: 95.5 %
Battery Voltage: 3.01 V
Brady Statistic AP VP Percent: 1 %
Brady Statistic AP VS Percent: 91 %
Brady Statistic AS VP Percent: 1 %
Brady Statistic AS VS Percent: 7.3 %
Brady Statistic RA Percent Paced: 91 %
Brady Statistic RV Percent Paced: 1.6 %
Date Time Interrogation Session: 20220512020028
Implantable Lead Implant Date: 20210812
Implantable Lead Implant Date: 20210812
Implantable Lead Location: 753859
Implantable Lead Location: 753860
Implantable Pulse Generator Implant Date: 20210812
Lead Channel Impedance Value: 480 Ohm
Lead Channel Impedance Value: 540 Ohm
Lead Channel Pacing Threshold Amplitude: 0.75 V
Lead Channel Pacing Threshold Amplitude: 0.75 V
Lead Channel Pacing Threshold Pulse Width: 0.5 ms
Lead Channel Pacing Threshold Pulse Width: 0.5 ms
Lead Channel Sensing Intrinsic Amplitude: 1.4 mV
Lead Channel Sensing Intrinsic Amplitude: 10.9 mV
Lead Channel Setting Pacing Amplitude: 2 V
Lead Channel Setting Pacing Amplitude: 2.5 V
Lead Channel Setting Pacing Pulse Width: 0.5 ms
Lead Channel Setting Sensing Sensitivity: 2 mV
Pulse Gen Model: 2272
Pulse Gen Serial Number: 3852532

## 2020-11-19 NOTE — Progress Notes (Signed)
Remote pacemaker transmission.   

## 2021-01-27 ENCOUNTER — Ambulatory Visit (INDEPENDENT_AMBULATORY_CARE_PROVIDER_SITE_OTHER): Payer: Medicare Other

## 2021-01-27 DIAGNOSIS — I495 Sick sinus syndrome: Secondary | ICD-10-CM | POA: Diagnosis not present

## 2021-01-27 LAB — CUP PACEART REMOTE DEVICE CHECK
Battery Remaining Longevity: 103 mo
Battery Remaining Percentage: 92 %
Battery Voltage: 3.01 V
Brady Statistic AP VP Percent: 1 %
Brady Statistic AP VS Percent: 92 %
Brady Statistic AS VP Percent: 1 %
Brady Statistic AS VS Percent: 6.1 %
Brady Statistic RA Percent Paced: 92 %
Brady Statistic RV Percent Paced: 1.5 %
Date Time Interrogation Session: 20220811032748
Implantable Lead Implant Date: 20210812
Implantable Lead Implant Date: 20210812
Implantable Lead Location: 753859
Implantable Lead Location: 753860
Implantable Pulse Generator Implant Date: 20210812
Lead Channel Impedance Value: 460 Ohm
Lead Channel Impedance Value: 480 Ohm
Lead Channel Pacing Threshold Amplitude: 0.75 V
Lead Channel Pacing Threshold Amplitude: 0.75 V
Lead Channel Pacing Threshold Pulse Width: 0.5 ms
Lead Channel Pacing Threshold Pulse Width: 0.5 ms
Lead Channel Sensing Intrinsic Amplitude: 5 mV
Lead Channel Sensing Intrinsic Amplitude: 9.7 mV
Lead Channel Setting Pacing Amplitude: 2 V
Lead Channel Setting Pacing Amplitude: 2.5 V
Lead Channel Setting Pacing Pulse Width: 0.5 ms
Lead Channel Setting Sensing Sensitivity: 2 mV
Pulse Gen Model: 2272
Pulse Gen Serial Number: 3852532

## 2021-02-11 ENCOUNTER — Encounter: Payer: Self-pay | Admitting: Student

## 2021-02-11 ENCOUNTER — Other Ambulatory Visit: Payer: Self-pay

## 2021-02-11 ENCOUNTER — Ambulatory Visit (INDEPENDENT_AMBULATORY_CARE_PROVIDER_SITE_OTHER): Payer: Medicare Other | Admitting: Student

## 2021-02-11 VITALS — BP 120/80 | HR 60 | Ht 75.0 in | Wt 200.0 lb

## 2021-02-11 DIAGNOSIS — I495 Sick sinus syndrome: Secondary | ICD-10-CM

## 2021-02-11 DIAGNOSIS — R001 Bradycardia, unspecified: Secondary | ICD-10-CM | POA: Diagnosis not present

## 2021-02-11 LAB — CUP PACEART INCLINIC DEVICE CHECK
Battery Remaining Longevity: 111 mo
Battery Voltage: 3.01 V
Brady Statistic RA Percent Paced: 91 %
Brady Statistic RV Percent Paced: 1.5 %
Date Time Interrogation Session: 20220826105714
Implantable Lead Implant Date: 20210812
Implantable Lead Implant Date: 20210812
Implantable Lead Location: 753859
Implantable Lead Location: 753860
Implantable Pulse Generator Implant Date: 20210812
Lead Channel Impedance Value: 475 Ohm
Lead Channel Impedance Value: 550 Ohm
Lead Channel Pacing Threshold Amplitude: 0.75 V
Lead Channel Pacing Threshold Amplitude: 0.75 V
Lead Channel Pacing Threshold Amplitude: 1 V
Lead Channel Pacing Threshold Amplitude: 1 V
Lead Channel Pacing Threshold Pulse Width: 0.5 ms
Lead Channel Pacing Threshold Pulse Width: 0.5 ms
Lead Channel Pacing Threshold Pulse Width: 0.5 ms
Lead Channel Pacing Threshold Pulse Width: 0.5 ms
Lead Channel Sensing Intrinsic Amplitude: 12 mV
Lead Channel Sensing Intrinsic Amplitude: 2.8 mV
Lead Channel Setting Pacing Amplitude: 2 V
Lead Channel Setting Pacing Amplitude: 2.5 V
Lead Channel Setting Pacing Pulse Width: 0.5 ms
Lead Channel Setting Sensing Sensitivity: 2 mV
Pulse Gen Model: 2272
Pulse Gen Serial Number: 3852532

## 2021-02-11 NOTE — Progress Notes (Signed)
Electrophysiology Office Note Date: 02/11/2021  ID:  Douglas Salinas, DOB 10/26/1965, MRN [id number]  PCP: Karle Plumber, MD Primary Cardiologist: None Electrophysiologist: Hillis Range, MD   CC: Post ED visit  Douglas Salinas is a 55 y.o. male seen today for Hillis Range, MD for acute visit due to muscle spasms .    Seen in ED 8/21 for chest pain. Came on suddenly and felt similar to his muscle spasms. Came along with diaphoresis and nausea. Feels like he nearly blacked out from the pain.  Troponin normal. CXR unremarkable.  It was suggested to him that his PPM could be stimulating his phrenic nerve.   He has continued to have intermittent muscles spasms. No exertional component. He has Familial spasmatic paraplegia. He has been told that it is progressive and will continue to spread upward. He has previously been told that he has muscle spasms all the way into his chest now, and remembers the same. He was concerned and upset about the possibility of his PPM contributing. He is WC bound at baseline re: same.   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 with ischemia in the mid anterior and apical ant. 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   GERD (gastroesophageal reflux disease) 07/18/2011   Headache    Hypertension    Hypokalemia    Hypomagnesemia    Internal hemorrhoids    Mixed hyperlipidemia    OSA on CPAP    Osteoporosis    Sleep apnea 07/18/2011   Vitamin D deficiency    Past Surgical History:  Procedure Laterality Date   APPENDECTOMY     HERNIA REPAIR     LEFT  HEART CATHETERIZATION WITH CORONARY ANGIOGRAM N/A 07/18/2011   Procedure: LEFT HEART CATHETERIZATION WITH CORONARY ANGIOGRAM;  Surgeon: Runell Gess, MD;  Location: [facility] CATH LAB;  Service: Cardiovascular;  Laterality: N/A;   ORTHOPEDIC SURGERY     PACEMAKER IMPLANT N/A 01/29/2020   Procedure: PACEMAKER IMPLANT;  Surgeon: Lanier Prude, MD;  Location: [facility] INVASIVE CV LAB;  Service: Cardiovascular;  Laterality: N/A;   tendon repair      Current Outpatient Medications  Medication Sig Dispense Refill   aspirin EC 81 MG tablet Take 81 mg by mouth daily.     atorvastatin (LIPITOR) 40 MG tablet Take 40 mg by mouth daily.     baclofen (LIORESAL) 20 MG tablet Take 20 mg by mouth 3 (three) times daily.     buprenorphine (BUTRANS) 15 MCG/HR Place 1 patch onto the skin every Thursday.      calcium carbonate (OSCAL) 1500 (600 Ca) MG TABS tablet Take 600 mg of elemental calcium by mouth 2 (two) times daily with a meal.      carbamazepine (TEGRETOL XR) 400 MG 12 hr tablet Take 400 mg by mouth 2 (two) times daily.     cholecalciferol (VITAMIN D3) 25 MCG (1000 UNIT) tablet Take 1,000 Units by mouth daily.     fluticasone (VERAMYST) 27.5 MCG/SPRAY nasal spray Place 2 sprays into the nose daily.  fluticasone furoate-vilanterol (BREO ELLIPTA) 100-25 MCG/INH AEPB Inhale 1 puff into the lungs daily.     isosorbide mononitrate (IMDUR) 30 MG 24 hr tablet Take 30 mg by mouth daily.     levETIRAcetam (KEPPRA) 750 MG tablet Take 750 mg by mouth 2 (two) times daily.     loratadine (CLARITIN) 10 MG tablet Take 10 mg by mouth daily as needed for allergies.      magnesium oxide (MAG-OX) 400 MG tablet Take 400 mg by mouth daily.     Multiple Vitamin (MULITIVITAMIN WITH MINERALS) TABS Take 1 tablet by mouth daily.     oxyCODONE-acetaminophen (PERCOCET) 5-325 MG per tablet Take 1 tablet by mouth at bedtime.      OXYGEN Inhale 2 L into the lungs at bedtime.      pantoprazole (PROTONIX) 40 MG tablet Take 40 mg by  mouth at bedtime.     PHENObarbital (LUMINAL) 30 MG tablet Take 30 mg by mouth at bedtime.      potassium chloride SA (KLOR-CON) 20 MEQ tablet Take 20 mEq by mouth daily.      No current facility-administered medications for this visit.    Allergies:   Other, Codeine, and Vicodin [hydrocodone-acetaminophen]   Social History: Social History   Socioeconomic History   Marital status: Single    Spouse name: Not on file   Number of children: Not on file   Years of education: Not on file   Highest education level: Not on file  Occupational History   Not on file  Tobacco Use   Smoking status: Never   Smokeless tobacco: Never  Substance and Sexual Activity   Alcohol use: No   Drug use: No   Sexual activity: Not on file  Other Topics Concern   Not on file  Social History Narrative   Lives in [place] with mother   Social Determinants of Health   Financial Resource Strain: Not on file  Food Insecurity: Not on file  Transportation Needs: Not on file  Physical Activity: Not on file  Stress: Not on file  Social Connections: Not on file  Intimate Partner Violence: Not on file    Family History: Family History  Problem Relation Age of Onset   Coronary artery disease Mother    Diabetes Mother    Hyperlipidemia Mother    Coronary artery disease Father    Hyperlipidemia Father    Hyperlipidemia Brother      Review of Systems: All other systems reviewed and are otherwise negative except as noted above.  Physical Exam: Vitals:   02/11/21 1022  BP: 120/80  Pulse: 60  SpO2: 94%  Weight: 200 lb (90.7 kg)  Height: 6\' 3"  (1.905 m)     GEN- The patient is well appearing, alert and oriented x 3 today.   HEENT: normocephalic, atraumatic; sclera clear, conjunctiva pink; hearing intact; oropharynx clear; neck supple  Lungs- Clear to ausculation bilaterally, normal work of breathing.  No wheezes, rales, rhonchi Heart- Regular rate and rhythm, no murmurs, rubs or gallops  GI-  soft, non-tender, non-distended, bowel sounds present  Extremities- no clubbing or cyanosis. No edema MS- no significant deformity or atrophy Skin- warm and dry, no rash or lesion; PPM pocket well healed Psych- euthymic mood, full affect Neuro- strength and sensation are intact  PPM Interrogation- reviewed in detail today,  See PACEART report  EKG:  EKG is not ordered today.  Recent Labs: No results found for requested labs within last 8760 hours.   Wt  Readings from Last 3 Encounters:  02/11/21 200 lb (90.7 kg)  05/10/20 185 lb (83.9 kg)  01/29/20 185 lb (83.9 kg)     Other studies Reviewed: Additional studies/ records that were reviewed today include: Previous EP office notes, Previous remote checks, Most recent labwork.   Assessment and Plan:  1. Symptomatic bradycardia s/p St. Jude PPM  Normal PPM function See Pace Art report No changes today Re-assurance given that his current device is not capable under normal circumstances of stimulating his phrenic nerve or diaphragm. Device assessed today at length and no symptoms with testing at [facility] (>5.0). Threshold, impedances, and sensing remain stable.  Pocket stable.  2. Syncope Caution with narcotics  3. Familial Spasmatic Paraplegia / Chronic Pain Progressive and debilitating. He has been told previously that this pain is related and his spasms have progressed to his chest wall. He has had multiple negative ishemic work ups.  Troponin negative at recent ER visit despite active chest wall pain.  Very low suspicion of cardiac involvement of his current pain. Alarm symptoms given.   Current medicines are reviewed at length with the patient today.   The patient does not have concerns regarding his medicines.    Disposition:   Follow up with EP APP in 12 months.     Dustin Flock, PA-C  02/11/2021 10:29 AM  [facility] [address] [place] [address] [phone]  (office) [phone] (fax)

## 2021-02-11 NOTE — Patient Instructions (Signed)
Medication Instructions:  Your physician recommends that you continue on your current medications as directed. Please refer to the Current Medication list given to you today.  *If you need a refill on your cardiac medications before your next appointment, please call your pharmacy*   Lab Work: NOne If you have labs (blood work) drawn today and your tests are completely normal, you will receive your results only by: MyChart Message (if you have MyChart) OR A paper copy in the mail If you have any lab test that is abnormal or we need to change your treatment, we will call you to review the results.   Follow-Up: At [facility], you and your health needs are our priority.  As part of our continuing mission to provide you with exceptional heart care, we have created designated Provider Care Teams.  These Care Teams include your primary Cardiologist (physician) and Advanced Practice Providers (APPs -  Physician Assistants and Nurse Practitioners) who all work together to provide you with the care you need, when you need it.  We recommend signing up for the patient portal called "MyChart".  Sign up information is provided on this After Visit Summary.  MyChart is used to connect with patients for Virtual Visits (Telemedicine).  Patients are able to view lab/test results, encounter notes, upcoming appointments, etc.  Non-urgent messages can be sent to your provider as well.   To learn more about what you can do with MyChart, go to [email or web address].    Your next appointment:   1 year(s)  The format for your next appointment:   In Person  Provider:   Casimiro Needle "Otilio Saber, PA-C

## 2021-02-17 NOTE — Progress Notes (Signed)
Remote pacemaker transmission.   

## 2021-04-28 ENCOUNTER — Ambulatory Visit (INDEPENDENT_AMBULATORY_CARE_PROVIDER_SITE_OTHER): Payer: Medicare Other

## 2021-04-28 ENCOUNTER — Encounter (INDEPENDENT_AMBULATORY_CARE_PROVIDER_SITE_OTHER): Payer: Self-pay

## 2021-04-28 DIAGNOSIS — I495 Sick sinus syndrome: Secondary | ICD-10-CM | POA: Diagnosis not present

## 2021-04-28 LAB — CUP PACEART REMOTE DEVICE CHECK
Battery Remaining Longevity: 101 mo
Battery Remaining Percentage: 90 %
Battery Voltage: 3.01 V
Brady Statistic AP VP Percent: 1 %
Brady Statistic AP VS Percent: 89 %
Brady Statistic AS VP Percent: 1 %
Brady Statistic AS VS Percent: 9.2 %
Brady Statistic RA Percent Paced: 88 %
Brady Statistic RV Percent Paced: 1.6 %
Date Time Interrogation Session: 20221110020012
Implantable Lead Implant Date: 20210812
Implantable Lead Implant Date: 20210812
Implantable Lead Location: 753859
Implantable Lead Location: 753860
Implantable Pulse Generator Implant Date: 20210812
Lead Channel Impedance Value: 430 Ohm
Lead Channel Impedance Value: 540 Ohm
Lead Channel Pacing Threshold Amplitude: 0.75 V
Lead Channel Pacing Threshold Amplitude: 1 V
Lead Channel Pacing Threshold Pulse Width: 0.5 ms
Lead Channel Pacing Threshold Pulse Width: 0.5 ms
Lead Channel Sensing Intrinsic Amplitude: 10 mV
Lead Channel Sensing Intrinsic Amplitude: 4.8 mV
Lead Channel Setting Pacing Amplitude: 2 V
Lead Channel Setting Pacing Amplitude: 2.5 V
Lead Channel Setting Pacing Pulse Width: 0.5 ms
Lead Channel Setting Sensing Sensitivity: 2 mV
Pulse Gen Model: 2272
Pulse Gen Serial Number: 3852532

## 2021-05-06 ENCOUNTER — Encounter: Payer: Medicare Other | Admitting: Physician Assistant

## 2021-05-09 NOTE — Progress Notes (Signed)
Remote pacemaker transmission.   

## 2021-07-28 ENCOUNTER — Ambulatory Visit (INDEPENDENT_AMBULATORY_CARE_PROVIDER_SITE_OTHER): Payer: Medicare Other

## 2021-07-28 DIAGNOSIS — I495 Sick sinus syndrome: Secondary | ICD-10-CM | POA: Diagnosis not present

## 2021-07-28 LAB — CUP PACEART REMOTE DEVICE CHECK
Battery Remaining Longevity: 97 mo
Battery Remaining Percentage: 88 %
Battery Voltage: 3.01 V
Brady Statistic AP VP Percent: 1 %
Brady Statistic AP VS Percent: 86 %
Brady Statistic AS VP Percent: 1 %
Brady Statistic AS VS Percent: 12 %
Brady Statistic RA Percent Paced: 86 %
Brady Statistic RV Percent Paced: 1.8 %
Date Time Interrogation Session: 20230209020015
Implantable Lead Implant Date: 20210812
Implantable Lead Implant Date: 20210812
Implantable Lead Location: 753859
Implantable Lead Location: 753860
Implantable Pulse Generator Implant Date: 20210812
Lead Channel Impedance Value: 410 Ohm
Lead Channel Impedance Value: 460 Ohm
Lead Channel Pacing Threshold Amplitude: 0.75 V
Lead Channel Pacing Threshold Amplitude: 1 V
Lead Channel Pacing Threshold Pulse Width: 0.5 ms
Lead Channel Pacing Threshold Pulse Width: 0.5 ms
Lead Channel Sensing Intrinsic Amplitude: 10.1 mV
Lead Channel Sensing Intrinsic Amplitude: 5 mV
Lead Channel Setting Pacing Amplitude: 2 V
Lead Channel Setting Pacing Amplitude: 2.5 V
Lead Channel Setting Pacing Pulse Width: 0.5 ms
Lead Channel Setting Sensing Sensitivity: 2 mV
Pulse Gen Model: 2272
Pulse Gen Serial Number: 3852532

## 2021-08-02 NOTE — Progress Notes (Signed)
Remote pacemaker transmission.   

## 2021-10-27 ENCOUNTER — Ambulatory Visit (INDEPENDENT_AMBULATORY_CARE_PROVIDER_SITE_OTHER): Payer: Medicare Other

## 2021-10-27 DIAGNOSIS — I495 Sick sinus syndrome: Secondary | ICD-10-CM | POA: Diagnosis not present

## 2021-10-27 LAB — CUP PACEART REMOTE DEVICE CHECK
Battery Remaining Longevity: 97 mo
Battery Remaining Percentage: 85 %
Battery Voltage: 3.01 V
Brady Statistic AP VP Percent: 1.3 %
Brady Statistic AP VS Percent: 80 %
Brady Statistic AS VP Percent: 1.2 %
Brady Statistic AS VS Percent: 16 %
Brady Statistic RA Percent Paced: 80 %
Brady Statistic RV Percent Paced: 2.4 %
Date Time Interrogation Session: 20230511133955
Implantable Lead Implant Date: 20210812
Implantable Lead Implant Date: 20210812
Implantable Lead Location: 753859
Implantable Lead Location: 753860
Implantable Pulse Generator Implant Date: 20210812
Lead Channel Impedance Value: 440 Ohm
Lead Channel Impedance Value: 550 Ohm
Lead Channel Pacing Threshold Amplitude: 0.75 V
Lead Channel Pacing Threshold Amplitude: 1 V
Lead Channel Pacing Threshold Pulse Width: 0.5 ms
Lead Channel Pacing Threshold Pulse Width: 0.5 ms
Lead Channel Sensing Intrinsic Amplitude: 1.8 mV
Lead Channel Sensing Intrinsic Amplitude: 11.4 mV
Lead Channel Setting Pacing Amplitude: 2 V
Lead Channel Setting Pacing Amplitude: 2.5 V
Lead Channel Setting Pacing Pulse Width: 0.5 ms
Lead Channel Setting Sensing Sensitivity: 2 mV
Pulse Gen Model: 2272
Pulse Gen Serial Number: 3852532

## 2021-11-03 NOTE — Progress Notes (Signed)
Remote pacemaker transmission.   

## 2022-01-26 ENCOUNTER — Ambulatory Visit (INDEPENDENT_AMBULATORY_CARE_PROVIDER_SITE_OTHER): Payer: Medicare Other

## 2022-01-26 DIAGNOSIS — I495 Sick sinus syndrome: Secondary | ICD-10-CM | POA: Diagnosis not present

## 2022-01-26 LAB — CUP PACEART REMOTE DEVICE CHECK
Battery Remaining Longevity: 92 mo
Battery Remaining Percentage: 83 %
Battery Voltage: 3.01 V
Brady Statistic AP VP Percent: 1.3 %
Brady Statistic AP VS Percent: 81 %
Brady Statistic AS VP Percent: 1.2 %
Brady Statistic AS VS Percent: 15 %
Brady Statistic RA Percent Paced: 81 %
Brady Statistic RV Percent Paced: 2.5 %
Date Time Interrogation Session: 20230810042237
Implantable Lead Implant Date: 20210812
Implantable Lead Implant Date: 20210812
Implantable Lead Location: 753859
Implantable Lead Location: 753860
Implantable Pulse Generator Implant Date: 20210812
Lead Channel Impedance Value: 440 Ohm
Lead Channel Impedance Value: 460 Ohm
Lead Channel Pacing Threshold Amplitude: 0.75 V
Lead Channel Pacing Threshold Amplitude: 1 V
Lead Channel Pacing Threshold Pulse Width: 0.5 ms
Lead Channel Pacing Threshold Pulse Width: 0.5 ms
Lead Channel Sensing Intrinsic Amplitude: 11.4 mV
Lead Channel Sensing Intrinsic Amplitude: 5 mV
Lead Channel Setting Pacing Amplitude: 2 V
Lead Channel Setting Pacing Amplitude: 2.5 V
Lead Channel Setting Pacing Pulse Width: 0.5 ms
Lead Channel Setting Sensing Sensitivity: 2 mV
Pulse Gen Model: 2272
Pulse Gen Serial Number: 3852532

## 2022-02-15 ENCOUNTER — Telehealth: Payer: Self-pay

## 2022-02-15 MED ORDER — METOPROLOL SUCCINATE ER 25 MG PO TB24: 25.0000 mg | ORAL_TABLET | Freq: Every day | ORAL | 3 refills | Status: DC

## 2022-02-15 NOTE — Telephone Encounter (Signed)
Left detailed message requesting call back to assess for symptoms and schedule 1 yr follow up with AT which is due.  Left phone number for device clinic.  Device alert for HVR EGM shows regular AS/VS, duration 1hr, mean HR 182 Presenting rhythm regular AP/VS Route for long duration   Presenting

## 2022-02-15 NOTE — Telephone Encounter (Signed)
Outreach made to Pt.  Per Pt he remembers this tachycardia episode, he was outside when he felt his heart racing and felt some chest discomfort.  Discussed with AT.  Pt will start Toprol XL 25 mg one tablet by mouth daily.  Follow up scheduled with AT for 02/28/2022.  Pt advised and all questions answered.

## 2022-02-22 NOTE — Progress Notes (Signed)
Remote pacemaker transmission.   

## 2022-02-27 NOTE — Progress Notes (Unsigned)
Electrophysiology Office Note Date: 02/28/2022  ID:  Douglas Salinas, DOB Feb 11, 1966, MRN [id number]  PCP: Karle Plumber, MD Primary Cardiologist: None Electrophysiologist: Hillis Range, MD -> Dr. Nelly Laurence   CC: Pacemaker follow-up  Douglas Salinas is a 56 y.o. male seen today for Hillis Range, MD for routine electrophysiology followup.  last being seen in our clinic the patient reports doing well overall. He did have chest discomfort associated with recent SVT episode, as prior. Otherwise, he denies dyspnea, PND, orthopnea, nausea, vomiting, dizziness, syncope, edema, weight gain, or early satiety.   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 with ischemia in the mid anterior and apical ant. 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   GERD (gastroesophageal reflux disease) 07/18/2011   Headache    Hypertension    Hypokalemia    Hypomagnesemia    Internal hemorrhoids    Mixed hyperlipidemia    OSA on CPAP    Osteoporosis    Sleep apnea 07/18/2011   Vitamin D deficiency    Past Surgical History:  Procedure Laterality Date   APPENDECTOMY     HERNIA REPAIR     LEFT HEART CATHETERIZATION WITH CORONARY ANGIOGRAM N/A 07/18/2011   Procedure: LEFT HEART CATHETERIZATION WITH CORONARY ANGIOGRAM;  Surgeon: Runell Gess, MD;  Location: [facility] CATH LAB;  Service: Cardiovascular;  Laterality: N/A;   ORTHOPEDIC SURGERY     PACEMAKER IMPLANT N/A 01/29/2020   Procedure: PACEMAKER IMPLANT;  Surgeon: Lanier Prude, MD;  Location: [facility] INVASIVE CV LAB;  Service: Cardiovascular;  Laterality:  N/A;   tendon repair      Current Outpatient Medications  Medication Sig Dispense Refill   aspirin EC 81 MG tablet Take 81 mg by mouth daily.     atorvastatin (LIPITOR) 40 MG tablet Take 40 mg by mouth daily.     baclofen (LIORESAL) 20 MG tablet Take 20 mg by mouth 3 (three) times daily.     buprenorphine (BUTRANS) 15 MCG/HR Place 1 patch onto the skin every Thursday.      calcium carbonate (OSCAL) 1500 (600 Ca) MG TABS tablet Take 600 mg of elemental calcium by mouth 2 (two) times daily with a meal.      carbamazepine (TEGRETOL XR) 400 MG 12 hr tablet Take 400 mg by mouth 2 (two) times daily.     cholecalciferol (VITAMIN D3) 25 MCG (1000 UNIT) tablet Take 1,000 Units by mouth daily.     fluticasone (VERAMYST) 27.5 MCG/SPRAY nasal spray Place 2 sprays into the nose daily.     fluticasone furoate-vilanterol (BREO ELLIPTA) 100-25 MCG/INH AEPB Inhale 1 puff into the lungs daily.     isosorbide mononitrate (IMDUR) 30 MG 24 hr tablet Take 30 mg by mouth daily.     levETIRAcetam (KEPPRA) 750 MG tablet Take 750 mg by mouth 2 (two) times daily.     loratadine (CLARITIN) 10 MG tablet Take 10 mg by mouth daily as needed for allergies.      magnesium oxide (MAG-OX) 400 MG tablet Take 400 mg by mouth  daily.     metoprolol succinate (TOPROL XL) 25 MG 24 hr tablet Take 1 tablet (25 mg total) by mouth daily. 90 tablet 3   Multiple Vitamin (MULITIVITAMIN WITH MINERALS) TABS Take 1 tablet by mouth daily.     oxyCODONE-acetaminophen (PERCOCET) 5-325 MG per tablet Take 1 tablet by mouth at bedtime.      OXYGEN Inhale 2 L into the lungs at bedtime.      pantoprazole (PROTONIX) 40 MG tablet Take 40 mg by mouth at bedtime.     PHENObarbital (LUMINAL) 30 MG tablet Take 30 mg by mouth at bedtime.      potassium chloride SA (KLOR-CON) 20 MEQ tablet Take 20 mEq by mouth daily.      No current facility-administered medications for this visit.    Allergies:   Other, Aleve [naproxen], Codeine, and Vicodin  [hydrocodone-acetaminophen]   Social History: Social History   Socioeconomic History   Marital status: Single    Spouse name: Not on file   Number of children: Not on file   Years of education: Not on file   Highest education level: Not on file  Occupational History   Not on file  Tobacco Use   Smoking status: Never   Smokeless tobacco: Never  Substance and Sexual Activity   Alcohol use: No   Drug use: No   Sexual activity: Not on file  Other Topics Concern   Not on file  Social History Narrative   Lives in [place] with mother   Social Determinants of Health   Financial Resource Strain: Not on file  Food Insecurity: Not on file  Transportation Needs: Not on file  Physical Activity: Not on file  Stress: Not on file  Social Connections: Not on file  Intimate Partner Violence: Not on file    Family History: Family History  Problem Relation Age of Onset   Coronary artery disease Mother    Diabetes Mother    Hyperlipidemia Mother    Coronary artery disease Father    Hyperlipidemia Father    Hyperlipidemia Brother      Review of Systems: All other systems reviewed and are otherwise negative except as noted above.  Physical Exam: Vitals:   02/28/22 1015  BP: 108/64  Pulse: 63  SpO2: 91%  Weight: 200 lb (90.7 kg)  Height: 6\' 3"  (1.905 m)     GEN- The patient is well appearing, alert and oriented x 3 today.   HEENT: normocephalic, atraumatic; sclera clear, conjunctiva pink; hearing intact; oropharynx clear; neck supple, no JVP Lymph- no cervical lymphadenopathy Lungs- Clear to ausculation bilaterally, normal work of breathing.  No wheezes, rales, rhonchi Heart- Regular  rate and rhythm, no murmurs, rubs or gallops, PMI not laterally displaced GI- soft, non-tender, non-distended, bowel sounds present, no hepatosplenomegaly Extremities- no clubbing or cyanosis. Trace peripheral edema; DP/PT/radial pulses 2+ bilaterally MS- no significant deformity or  atrophy Skin- warm and dry, no rash or lesion; PPM pocket well healed Psych- euthymic mood, full affect Neuro- strength and sensation are intact  PPM Interrogation-  reviewed in detail today,  See PACEART report.  EKG:  EKG is ordered today. Personal review of ekg ordered today shows AP-Vs at 63 bpm   Recent Labs: No results found for requested labs within last 365 days.   Wt Readings from Last 3 Encounters:  02/28/22 200 lb (90.7 kg)  02/11/21 200 lb (90.7 kg)  05/10/20 185 lb (83.9 kg)     Other studies Reviewed: Additional studies/ records  that were reviewed today include: Previous EP office notes, Previous remote checks, Most recent labwork.   Assessment and Plan:  1. Symptomatic bradycardia s/p St. Jude PPM  Normal PPM function See Pace Art report No changes today  2. Syncope Caution with narcotics  3. Familial Spasmatic Paraplegia / Chronic Pain Progressive and debilitating. He has been told previously that this pain is related and his spasms have progressed to his chest wall. He has had multiple negative ishemic work ups.  Troponin negative at previous ER visit despite active chest wall pain.   4. SVT Continue toprol xl 25 mg daily. Titrated up as needed.   Current medicines are reviewed at length with the patient today.    Labs/ tests ordered today include:  Orders Placed This Encounter  Procedures   Basic metabolic panel   Magnesium   EKG 12-Lead     Disposition:   Follow up with Dr. Nelly Laurence  in 12 months    Signed, Graciella Freer, PA-C  02/28/2022 10:29 AM  [facility] [address] [place] [address] [phone] (office) [phone] (fax)

## 2022-02-28 ENCOUNTER — Ambulatory Visit: Payer: Medicare Other | Attending: Student | Admitting: Student

## 2022-02-28 ENCOUNTER — Encounter: Payer: Self-pay | Admitting: Student

## 2022-02-28 VITALS — BP 108/64 | HR 63 | Ht 75.0 in | Wt 200.0 lb

## 2022-02-28 DIAGNOSIS — I495 Sick sinus syndrome: Secondary | ICD-10-CM | POA: Diagnosis not present

## 2022-02-28 DIAGNOSIS — I471 Supraventricular tachycardia: Secondary | ICD-10-CM

## 2022-02-28 DIAGNOSIS — R001 Bradycardia, unspecified: Secondary | ICD-10-CM

## 2022-02-28 NOTE — Patient Instructions (Signed)
Medication Instructions:  Your physician recommends that you continue on your current medications as directed. Please refer to the Current Medication list given to you today.  *If you need a refill on your cardiac medications before your next appointment, please call your pharmacy*   Lab Work: TODAY: BMET, Mag  If you have labs (blood work) drawn today and your tests are completely normal, you will receive your results only by: MyChart Message (if you have MyChart) OR A paper copy in the mail If you have any lab test that is abnormal or we need to change your treatment, we will call you to review the results.   Follow-Up: At [facility], you and your health needs are our priority.  As part of our continuing mission to provide you with exceptional heart care, we have created designated Provider Care Teams.  These Care Teams include your primary Cardiologist (physician) and Advanced Practice Providers (APPs -  Physician Assistants and Nurse Practitioners) who all work together to provide you with the care you need, when you need it.  We recommend signing up for the patient portal called "MyChart".  Sign up information is provided on this After Visit Summary.  MyChart is used to connect with patients for Virtual Visits (Telemedicine).  Patients are able to view lab/test results, encounter notes, upcoming appointments, etc.  Non-urgent messages can be sent to your provider as well.   To learn more about what you can do with MyChart, go to [email or web address].    Your next appointment:   1 year(s)  The format for your next appointment:   In Person  Provider:   York Pellant, MD

## 2022-03-01 LAB — BASIC METABOLIC PANEL
BUN/Creatinine Ratio: 21 — ABNORMAL HIGH (ref 9–20)
BUN: 19 mg/dL (ref 6–24)
CO2: 29 mmol/L (ref 20–29)
Calcium: 9.1 mg/dL (ref 8.7–10.2)
Chloride: 107 mmol/L — ABNORMAL HIGH (ref 96–106)
Creatinine, Ser: 0.9 mg/dL (ref 0.76–1.27)
Glucose: 109 mg/dL — ABNORMAL HIGH (ref 70–99)
Potassium: 4.2 mmol/L (ref 3.5–5.2)
Sodium: 148 mmol/L — ABNORMAL HIGH (ref 134–144)
eGFR: 99 mL/min/{1.73_m2} (ref >59)

## 2022-03-01 LAB — MAGNESIUM: Magnesium: 2.1 mg/dL (ref 1.6–2.3)

## 2022-04-27 ENCOUNTER — Ambulatory Visit (INDEPENDENT_AMBULATORY_CARE_PROVIDER_SITE_OTHER): Payer: Medicare Other

## 2022-04-27 DIAGNOSIS — I495 Sick sinus syndrome: Secondary | ICD-10-CM

## 2022-04-29 LAB — CUP PACEART REMOTE DEVICE CHECK
Battery Remaining Longevity: 91 mo
Battery Remaining Percentage: 81 %
Battery Voltage: 3.01 V
Brady Statistic AP VP Percent: 1 %
Brady Statistic AP VS Percent: 99 %
Brady Statistic AS VP Percent: 1 %
Brady Statistic AS VS Percent: 1 %
Brady Statistic RA Percent Paced: 99 %
Brady Statistic RV Percent Paced: 1 %
Date Time Interrogation Session: 20231109162441
Implantable Lead Connection Status: 753985
Implantable Lead Connection Status: 753985
Implantable Lead Implant Date: 20210812
Implantable Lead Implant Date: 20210812
Implantable Lead Location: 753859
Implantable Lead Location: 753860
Implantable Pulse Generator Implant Date: 20210812
Lead Channel Impedance Value: 430 Ohm
Lead Channel Impedance Value: 540 Ohm
Lead Channel Pacing Threshold Amplitude: 0.75 V
Lead Channel Pacing Threshold Amplitude: 1 V
Lead Channel Pacing Threshold Pulse Width: 0.5 ms
Lead Channel Pacing Threshold Pulse Width: 0.5 ms
Lead Channel Sensing Intrinsic Amplitude: 10.9 mV
Lead Channel Sensing Intrinsic Amplitude: 5 mV
Lead Channel Setting Pacing Amplitude: 2 V
Lead Channel Setting Pacing Amplitude: 2.5 V
Lead Channel Setting Pacing Pulse Width: 0.5 ms
Lead Channel Setting Sensing Sensitivity: 2 mV
Pulse Gen Model: 2272
Pulse Gen Serial Number: 3852532

## 2022-05-05 NOTE — Progress Notes (Signed)
Remote pacemaker transmission.   

## 2022-07-27 ENCOUNTER — Ambulatory Visit (INDEPENDENT_AMBULATORY_CARE_PROVIDER_SITE_OTHER): Payer: 59

## 2022-07-27 DIAGNOSIS — I495 Sick sinus syndrome: Secondary | ICD-10-CM | POA: Diagnosis not present

## 2022-07-27 LAB — CUP PACEART REMOTE DEVICE CHECK
Battery Remaining Longevity: 88 mo
Battery Remaining Percentage: 78 %
Battery Voltage: 3.01 V
Brady Statistic AP VP Percent: 1 %
Brady Statistic AP VS Percent: 98 %
Brady Statistic AS VP Percent: 1 %
Brady Statistic AS VS Percent: 1.6 %
Brady Statistic RA Percent Paced: 98 %
Brady Statistic RV Percent Paced: 1 %
Date Time Interrogation Session: 20240208020014
Implantable Lead Connection Status: 753985
Implantable Lead Connection Status: 753985
Implantable Lead Implant Date: 20210812
Implantable Lead Implant Date: 20210812
Implantable Lead Location: 753859
Implantable Lead Location: 753860
Implantable Pulse Generator Implant Date: 20210812
Lead Channel Impedance Value: 440 Ohm
Lead Channel Impedance Value: 530 Ohm
Lead Channel Pacing Threshold Amplitude: 0.75 V
Lead Channel Pacing Threshold Amplitude: 1 V
Lead Channel Pacing Threshold Pulse Width: 0.5 ms
Lead Channel Pacing Threshold Pulse Width: 0.5 ms
Lead Channel Sensing Intrinsic Amplitude: 11.9 mV
Lead Channel Sensing Intrinsic Amplitude: 5 mV
Lead Channel Setting Pacing Amplitude: 2 V
Lead Channel Setting Pacing Amplitude: 2.5 V
Lead Channel Setting Pacing Pulse Width: 0.5 ms
Lead Channel Setting Sensing Sensitivity: 2 mV
Pulse Gen Model: 2272
Pulse Gen Serial Number: 3852532

## 2022-10-26 ENCOUNTER — Ambulatory Visit (INDEPENDENT_AMBULATORY_CARE_PROVIDER_SITE_OTHER): Payer: 59

## 2022-10-26 DIAGNOSIS — I495 Sick sinus syndrome: Secondary | ICD-10-CM | POA: Diagnosis not present

## 2022-10-26 LAB — CUP PACEART REMOTE DEVICE CHECK
Battery Remaining Longevity: 85 mo
Battery Remaining Percentage: 76 %
Battery Voltage: 3.01 V
Brady Statistic AP VP Percent: 1 %
Brady Statistic AP VS Percent: 97 %
Brady Statistic AS VP Percent: 1 %
Brady Statistic AS VS Percent: 2.6 %
Brady Statistic RA Percent Paced: 97 %
Brady Statistic RV Percent Paced: 1 %
Date Time Interrogation Session: 20240509020031
Implantable Lead Connection Status: 753985
Implantable Lead Connection Status: 753985
Implantable Lead Implant Date: 20210812
Implantable Lead Implant Date: 20210812
Implantable Lead Location: 753859
Implantable Lead Location: 753860
Implantable Pulse Generator Implant Date: 20210812
Lead Channel Impedance Value: 440 Ohm
Lead Channel Impedance Value: 490 Ohm
Lead Channel Pacing Threshold Amplitude: 0.75 V
Lead Channel Pacing Threshold Amplitude: 1 V
Lead Channel Pacing Threshold Pulse Width: 0.5 ms
Lead Channel Pacing Threshold Pulse Width: 0.5 ms
Lead Channel Sensing Intrinsic Amplitude: 12 mV
Lead Channel Sensing Intrinsic Amplitude: 2.6 mV
Lead Channel Setting Pacing Amplitude: 2 V
Lead Channel Setting Pacing Amplitude: 2.5 V
Lead Channel Setting Pacing Pulse Width: 0.5 ms
Lead Channel Setting Sensing Sensitivity: 2 mV
Pulse Gen Model: 2272
Pulse Gen Serial Number: 3852532

## 2022-10-30 ENCOUNTER — Telehealth: Payer: Self-pay

## 2022-10-30 NOTE — Telephone Encounter (Signed)
Following alert received from [employer] received for HVR 5/10 @ 19:38, duration 1hr , mean HR 180. EGM shows atrial driven 1:1, 1 AMS 5/10 @ 20:27 appears to be continuation of above, duration 1hr , HR 192. Hx of SVT, Toprol - route to triage for long duration.   Patient has hx of elevated rates, although shorter in duration. Attempted to contact patient. No answer, LMTCB.

## 2022-10-30 NOTE — Telephone Encounter (Signed)
Pt left a voicemail returning nurse call. Pt cell phone number [phone].

## 2022-10-30 NOTE — Telephone Encounter (Signed)
Returned phone call.   Patient reports during episode on 10/27/22 ~ he reported of palpations and chest pain.   Patient reports of chest pain yesterday and also woke patient up this morning from sleeping with chest pain. Reports pain radiating down left arm at this time.  Patient advised to call 911 and go to ED for evaluation. Patient voiced understanding and agreeable to plan. States he lives in [place].

## 2022-10-31 NOTE — Telephone Encounter (Signed)
I will pass along to [employer] to see their thoughts.

## 2022-11-14 NOTE — Progress Notes (Signed)
Remote pacemaker transmission.   

## 2023-01-25 ENCOUNTER — Ambulatory Visit (INDEPENDENT_AMBULATORY_CARE_PROVIDER_SITE_OTHER): Payer: 59

## 2023-01-25 DIAGNOSIS — I495 Sick sinus syndrome: Secondary | ICD-10-CM | POA: Diagnosis not present

## 2023-01-28 ENCOUNTER — Other Ambulatory Visit: Payer: Self-pay | Admitting: Student

## 2023-03-02 ENCOUNTER — Encounter: Payer: 59 | Admitting: Cardiovascular Disease

## 2023-03-13 NOTE — Progress Notes (Signed)
Remote pacemaker transmission.   

## 2023-03-29 ENCOUNTER — Ambulatory Visit: Payer: 59 | Attending: Cardiovascular Disease | Admitting: Physician Assistant

## 2023-03-29 ENCOUNTER — Encounter: Payer: Self-pay | Admitting: Physician Assistant

## 2023-03-29 VITALS — BP 122/80 | HR 63 | Ht 75.0 in | Wt 208.0 lb

## 2023-03-29 DIAGNOSIS — Z950 Presence of cardiac pacemaker: Secondary | ICD-10-CM

## 2023-03-29 DIAGNOSIS — I471 Supraventricular tachycardia, unspecified: Secondary | ICD-10-CM

## 2023-03-29 DIAGNOSIS — I10 Essential (primary) hypertension: Secondary | ICD-10-CM | POA: Diagnosis not present

## 2023-03-29 LAB — CUP PACEART INCLINIC DEVICE CHECK
Battery Remaining Longevity: 86 mo
Battery Voltage: 3.01 V
Brady Statistic RA Percent Paced: 96 %
Brady Statistic RV Percent Paced: 0.4 %
Date Time Interrogation Session: 20241010165124
Implantable Lead Connection Status: 753985
Implantable Lead Connection Status: 753985
Implantable Lead Implant Date: 20210812
Implantable Lead Implant Date: 20210812
Implantable Lead Location: 753859
Implantable Lead Location: 753860
Implantable Pulse Generator Implant Date: 20210812
Lead Channel Impedance Value: 437.5 Ohm
Lead Channel Impedance Value: 512.5 Ohm
Lead Channel Pacing Threshold Amplitude: 0.75 V
Lead Channel Pacing Threshold Amplitude: 0.75 V
Lead Channel Pacing Threshold Amplitude: 1.25 V
Lead Channel Pacing Threshold Amplitude: 1.25 V
Lead Channel Pacing Threshold Pulse Width: 0.5 ms
Lead Channel Pacing Threshold Pulse Width: 0.5 ms
Lead Channel Pacing Threshold Pulse Width: 0.5 ms
Lead Channel Pacing Threshold Pulse Width: 0.5 ms
Lead Channel Sensing Intrinsic Amplitude: 10.4 mV
Lead Channel Sensing Intrinsic Amplitude: 3.7 mV
Lead Channel Setting Pacing Amplitude: 2 V
Lead Channel Setting Pacing Amplitude: 2.5 V
Lead Channel Setting Pacing Pulse Width: 0.5 ms
Lead Channel Setting Sensing Sensitivity: 2 mV
Pulse Gen Model: 2272
Pulse Gen Serial Number: 3852532

## 2023-03-29 NOTE — Patient Instructions (Addendum)
Medication Instructions:   Your physician recommends that you continue on your current medications as directed. Please refer to the Current Medication list given to you today.  *If you need a refill on your cardiac medications before your next appointment, please call your pharmacy*   Lab Work: NONE ORDERED  TODAY   If you have labs (blood work) drawn today and your tests are completely normal, you will receive your results only by: MyChart Message (if you have MyChart) OR A paper copy in the mail If you have any lab test that is abnormal or we need to change your treatment, we will call you to review the results.   Testing/Procedures: NONE ORDERED  TODAY     Follow-Up: At [facility], you and your health needs are our priority.  As part of our continuing mission to provide you with exceptional heart care, we have created designated Provider Care Teams.  These Care Teams include your primary Cardiologist (physician) and Advanced Practice Providers (APPs -  Physician Assistants and Nurse Practitioners) who all work together to provide you with the care you need, when you need it.  We recommend signing up for the patient portal called "MyChart".  Sign up information is provided on this After Visit Summary.  MyChart is used to connect with patients for Virtual Visits (Telemedicine).  Patients are able to view lab/test results, encounter notes, upcoming appointments, etc.  Non-urgent messages can be sent to your provider as well.   To learn more about what you can do with MyChart, go to [email or web address].    Your next appointment:   1 year(s)  Provider:   Augustus Mealor, MD    Other Instructions  

## 2023-03-29 NOTE — Progress Notes (Signed)
  Cardiology Office Note:  .   Date:  03/29/2023  ID:  Douglas Salinas, DOB [birth year], MRN [id number] PCP: Karle Plumber, MD  [facility] Providers Cardiologist:  None Electrophysiologist:  Hillis Range, MD (Inactive) {  History of Present Illness: .   Douglas Salinas is a 57 y.o. male w/PMHx of SVT, symptomatic bradycardia/syncope w/PPM, DM, HTN, Epilepsy, OSA w/CPAP, Familial Spasmatic Paraplegia / Chronic Pain    He saw Dr. Johney Frame 2021, described as chrinically ill, using wheelchair, heavily sedated (meds) and suspected that falls and blackouts partically 2/2 his meds.  Otherwise doing well w/PPM No changes were made, planned for annual visits with APP  He saw A. Tillery, PA-C 02/28/22, reported some CP associated with an SVT.  Reports hx of Familial Spasmatic Paraplegia / Chronic Pain This a progressive and debilitating diagnosis and has been told previously that this pain is related and his spasms have progressed to his chest wall. He has had multiple negative ishemic work ups.   Today's visit is scheduled as an annual visit  ROS:   He is accompanied by his mom that he lives with. He reports an upcoming procedure to replace his pain medication pump (Has an intrathecal pump for chronic pain/baclofen and morphine) He comes in a wheelchair.  He reports since the change/or addition to his medications made last year he has done quite well, with infrequent palpitations. Denies any near syncope or syncope No SOB No pacer concerns    Device information Abbott dual chamber PPM implanted 01/29/20   Studies Reviewed: Marland Kitchen    EKG done today and reviewed by myself:  APaced/V sensed 63bpm  DEVICE interrogation done today and reviewed by myself Battery and lead measurements are good One SVT back in May, none otherwise Has some false mode switches looks like prompted by PACs perhaps  late retrograde these are brief No AFib   Risk Assessment/Calculations:    Physical  Exam:   VS:  There were no vitals taken for this visit.   Wt Readings from Last 3 Encounters:  02/28/22 200 lb (90.7 kg)  02/11/21 200 lb (90.7 kg)  05/10/20 185 lb (83.9 kg)    GEN: Well nourished, well developed in no acute distress NECK: No JVD; No carotid bruits CARDIAC: RRR, no murmurs, rubs, gallops RESPIRATORY:  CTA b/l without rales, wheezing or rhonchi  ABDOMEN: Soft, non-tender, non-distended EXTREMITIES:  No edema; No deformity   PPM site: is stable, no thinning, fluctuation, tethering  ASSESSMENT AND PLAN: .    PPM Intact function No programming changes made Not dependent has SB 40's underneath  HTN Looks good  SVT One episode only since last year, 1 hour 44 min No changes today needed   We discussed need to transition to a new EP MD, Dr. Nelly Laurence has been reading, will see him next year    Dispo: remotes as usual, in clinic in a year again, sooner if needed   Signed, Sheilah Pigeon, PA-C

## 2023-04-26 ENCOUNTER — Ambulatory Visit (INDEPENDENT_AMBULATORY_CARE_PROVIDER_SITE_OTHER): Payer: 59

## 2023-04-26 ENCOUNTER — Other Ambulatory Visit: Payer: Self-pay | Admitting: Student

## 2023-04-26 DIAGNOSIS — I471 Supraventricular tachycardia, unspecified: Secondary | ICD-10-CM

## 2023-04-26 LAB — CUP PACEART REMOTE DEVICE CHECK
Battery Remaining Longevity: 79 mo
Battery Remaining Percentage: 71 %
Battery Voltage: 3.01 V
Brady Statistic AP VP Percent: 1 %
Brady Statistic AP VS Percent: 97 %
Brady Statistic AS VP Percent: 1 %
Brady Statistic AS VS Percent: 2.4 %
Brady Statistic RA Percent Paced: 97 %
Brady Statistic RV Percent Paced: 1 %
Date Time Interrogation Session: 20241107045948
Implantable Lead Connection Status: 753985
Implantable Lead Connection Status: 753985
Implantable Lead Implant Date: 20210812
Implantable Lead Implant Date: 20210812
Implantable Lead Location: 753859
Implantable Lead Location: 753860
Implantable Pulse Generator Implant Date: 20210812
Lead Channel Impedance Value: 430 Ohm
Lead Channel Impedance Value: 480 Ohm
Lead Channel Pacing Threshold Amplitude: 0.75 V
Lead Channel Pacing Threshold Amplitude: 1.25 V
Lead Channel Pacing Threshold Pulse Width: 0.5 ms
Lead Channel Pacing Threshold Pulse Width: 0.5 ms
Lead Channel Sensing Intrinsic Amplitude: 12 mV
Lead Channel Sensing Intrinsic Amplitude: 5 mV
Lead Channel Setting Pacing Amplitude: 2 V
Lead Channel Setting Pacing Amplitude: 2.5 V
Lead Channel Setting Pacing Pulse Width: 0.5 ms
Lead Channel Setting Sensing Sensitivity: 2 mV
Pulse Gen Model: 2272
Pulse Gen Serial Number: 3852532

## 2023-05-09 NOTE — Progress Notes (Signed)
Remote pacemaker transmission.   

## 2023-05-14 ENCOUNTER — Encounter: Payer: 59 | Admitting: Cardiovascular Disease

## 2023-07-26 ENCOUNTER — Ambulatory Visit (INDEPENDENT_AMBULATORY_CARE_PROVIDER_SITE_OTHER): Payer: 59

## 2023-07-26 DIAGNOSIS — I495 Sick sinus syndrome: Secondary | ICD-10-CM | POA: Diagnosis not present

## 2023-07-26 LAB — CUP PACEART REMOTE DEVICE CHECK
Battery Remaining Longevity: 75 mo
Battery Remaining Percentage: 69 %
Battery Voltage: 2.99 V
Brady Statistic AP VP Percent: 1 %
Brady Statistic AP VS Percent: 96 %
Brady Statistic AS VP Percent: 1 %
Brady Statistic AS VS Percent: 3.8 %
Brady Statistic RA Percent Paced: 96 %
Brady Statistic RV Percent Paced: 1 %
Date Time Interrogation Session: 20250206023428
Implantable Lead Connection Status: 753985
Implantable Lead Connection Status: 753985
Implantable Lead Implant Date: 20210812
Implantable Lead Implant Date: 20210812
Implantable Lead Location: 753859
Implantable Lead Location: 753860
Implantable Pulse Generator Implant Date: 20210812
Lead Channel Impedance Value: 430 Ohm
Lead Channel Impedance Value: 440 Ohm
Lead Channel Pacing Threshold Amplitude: 0.75 V
Lead Channel Pacing Threshold Amplitude: 1.25 V
Lead Channel Pacing Threshold Pulse Width: 0.5 ms
Lead Channel Pacing Threshold Pulse Width: 0.5 ms
Lead Channel Sensing Intrinsic Amplitude: 5 mV
Lead Channel Sensing Intrinsic Amplitude: 9.7 mV
Lead Channel Setting Pacing Amplitude: 2 V
Lead Channel Setting Pacing Amplitude: 2.5 V
Lead Channel Setting Pacing Pulse Width: 0.5 ms
Lead Channel Setting Sensing Sensitivity: 2 mV
Pulse Gen Model: 2272
Pulse Gen Serial Number: 3852532

## 2023-08-30 NOTE — Progress Notes (Signed)
 Remote pacemaker transmission.

## 2023-10-25 ENCOUNTER — Ambulatory Visit (INDEPENDENT_AMBULATORY_CARE_PROVIDER_SITE_OTHER): Payer: 59

## 2023-10-25 DIAGNOSIS — I495 Sick sinus syndrome: Secondary | ICD-10-CM

## 2023-10-25 LAB — CUP PACEART REMOTE DEVICE CHECK
Battery Remaining Longevity: 73 mo
Battery Remaining Percentage: 67 %
Battery Voltage: 2.99 V
Brady Statistic AP VP Percent: 1 %
Brady Statistic AP VS Percent: 96 %
Brady Statistic AS VP Percent: 1 %
Brady Statistic AS VS Percent: 3.2 %
Brady Statistic RA Percent Paced: 96 %
Brady Statistic RV Percent Paced: 1 %
Date Time Interrogation Session: 20250508020030
Implantable Lead Connection Status: 753985
Implantable Lead Connection Status: 753985
Implantable Lead Implant Date: 20210812
Implantable Lead Implant Date: 20210812
Implantable Lead Location: 753859
Implantable Lead Location: 753860
Implantable Pulse Generator Implant Date: 20210812
Lead Channel Impedance Value: 440 Ohm
Lead Channel Impedance Value: 440 Ohm
Lead Channel Pacing Threshold Amplitude: 0.75 V
Lead Channel Pacing Threshold Amplitude: 1.25 V
Lead Channel Pacing Threshold Pulse Width: 0.5 ms
Lead Channel Pacing Threshold Pulse Width: 0.5 ms
Lead Channel Sensing Intrinsic Amplitude: 11.2 mV
Lead Channel Sensing Intrinsic Amplitude: 5 mV
Lead Channel Setting Pacing Amplitude: 2 V
Lead Channel Setting Pacing Amplitude: 2.5 V
Lead Channel Setting Pacing Pulse Width: 0.5 ms
Lead Channel Setting Sensing Sensitivity: 2 mV
Pulse Gen Model: 2272
Pulse Gen Serial Number: 3852532

## 2023-11-02 ENCOUNTER — Ambulatory Visit: Payer: Self-pay | Admitting: Cardiovascular Disease

## 2023-12-03 NOTE — Progress Notes (Signed)
 Remote pacemaker transmission.

## 2024-01-24 ENCOUNTER — Ambulatory Visit: Payer: 59

## 2024-01-24 DIAGNOSIS — I495 Sick sinus syndrome: Secondary | ICD-10-CM

## 2024-01-24 LAB — CUP PACEART REMOTE DEVICE CHECK
Battery Remaining Longevity: 70 mo
Battery Remaining Percentage: 64 %
Battery Voltage: 2.99 V
Brady Statistic AP VP Percent: 1 %
Brady Statistic AP VS Percent: 97 %
Brady Statistic AS VP Percent: 1 %
Brady Statistic AS VS Percent: 2.8 %
Brady Statistic RA Percent Paced: 97 %
Brady Statistic RV Percent Paced: 1 %
Date Time Interrogation Session: 20250807020013
Implantable Lead Connection Status: 753985
Implantable Lead Connection Status: 753985
Implantable Lead Implant Date: 20210812
Implantable Lead Implant Date: 20210812
Implantable Lead Location: 753859
Implantable Lead Location: 753860
Implantable Pulse Generator Implant Date: 20210812
Lead Channel Impedance Value: 410 Ohm
Lead Channel Impedance Value: 430 Ohm
Lead Channel Pacing Threshold Amplitude: 0.75 V
Lead Channel Pacing Threshold Amplitude: 1.25 V
Lead Channel Pacing Threshold Pulse Width: 0.5 ms
Lead Channel Pacing Threshold Pulse Width: 0.5 ms
Lead Channel Sensing Intrinsic Amplitude: 10.4 mV
Lead Channel Sensing Intrinsic Amplitude: 4.9 mV
Lead Channel Setting Pacing Amplitude: 2 V
Lead Channel Setting Pacing Amplitude: 2.5 V
Lead Channel Setting Pacing Pulse Width: 0.5 ms
Lead Channel Setting Sensing Sensitivity: 2 mV
Pulse Gen Model: 2272
Pulse Gen Serial Number: 3852532

## 2024-01-29 ENCOUNTER — Ambulatory Visit: Payer: Self-pay | Admitting: Cardiovascular Disease

## 2024-03-15 NOTE — Progress Notes (Signed)
 Remote PPM Transmission

## 2024-04-21 ENCOUNTER — Other Ambulatory Visit: Payer: Self-pay | Admitting: Student

## 2024-04-23 MED ORDER — METOPROLOL SUCCINATE ER 25 MG PO TB24
25.0000 mg | ORAL_TABLET | Freq: Every day | ORAL | 0 refills | Status: AC
Start: 2024-04-23 — End: ?

## 2024-04-24 ENCOUNTER — Ambulatory Visit: Payer: 59

## 2024-04-24 DIAGNOSIS — I495 Sick sinus syndrome: Secondary | ICD-10-CM

## 2024-04-25 LAB — CUP PACEART REMOTE DEVICE CHECK
Battery Remaining Longevity: 67 mo
Battery Remaining Percentage: 62 %
Battery Voltage: 2.99 V
Brady Statistic AP VP Percent: 1 %
Brady Statistic AP VS Percent: 97 %
Brady Statistic AS VP Percent: 1 %
Brady Statistic AS VS Percent: 2.8 %
Brady Statistic RA Percent Paced: 97 %
Brady Statistic RV Percent Paced: 1 %
Date Time Interrogation Session: 20251107081029
Implantable Lead Connection Status: 753985
Implantable Lead Connection Status: 753985
Implantable Lead Implant Date: 20210812
Implantable Lead Implant Date: 20210812
Implantable Lead Location: 753859
Implantable Lead Location: 753860
Implantable Pulse Generator Implant Date: 20210812
Lead Channel Impedance Value: 430 Ohm
Lead Channel Impedance Value: 430 Ohm
Lead Channel Pacing Threshold Amplitude: 0.75 V
Lead Channel Pacing Threshold Amplitude: 1.25 V
Lead Channel Pacing Threshold Pulse Width: 0.5 ms
Lead Channel Pacing Threshold Pulse Width: 0.5 ms
Lead Channel Sensing Intrinsic Amplitude: 1.3 mV
Lead Channel Sensing Intrinsic Amplitude: 10.1 mV
Lead Channel Setting Pacing Amplitude: 2 V
Lead Channel Setting Pacing Amplitude: 2.5 V
Lead Channel Setting Pacing Pulse Width: 0.5 ms
Lead Channel Setting Sensing Sensitivity: 2 mV
Pulse Gen Model: 2272
Pulse Gen Serial Number: 3852532

## 2024-04-28 NOTE — Progress Notes (Signed)
 Remote PPM Transmission

## 2024-04-30 ENCOUNTER — Ambulatory Visit: Payer: Self-pay | Admitting: Cardiovascular Disease

## 2024-05-05 LAB — COLOGUARD

## 2024-06-04 ENCOUNTER — Other Ambulatory Visit: Payer: Self-pay | Admitting: Student

## 2024-06-12 ENCOUNTER — Other Ambulatory Visit: Payer: Self-pay | Admitting: Student

## 2024-06-24 ENCOUNTER — Telehealth: Payer: Self-pay | Admitting: Student

## 2024-06-24 NOTE — Telephone Encounter (Signed)
" °*  STAT* If patient is at the pharmacy, call can be transferred to refill team.   1. Which medications need to be refilled? (please list name of each medication and dose if known) metoprolol  succinate (TOPROL -XL) 25 MG 24 hr tablet   2. Which pharmacy/location (including street and city if local pharmacy) is medication to be sent to [id number] - [place], [place] - [address] Phone: [phone]  Fax: [phone]      3. Do they need a 30 day or 90 day supply? 90 Pt is out of medication  Pt made appt for 08/19/24 "

## 2024-06-25 MED ORDER — METOPROLOL SUCCINATE ER 25 MG PO TB24: 25.0000 mg | ORAL_TABLET | Freq: Every day | ORAL | 0 refills | Status: AC

## 2024-06-25 NOTE — Telephone Encounter (Signed)
 Phone call received from Pt.  He is out of his medication.  He states he did not mean to miss his last appointment.  He did not get a reminder call like he normally does.  Gave Pt 90 days of Toprol  with no refills.  This will cover him until his scheduled follow up with Renee in March.  Pt advised of refill and follow up appointment.  Gave directions to new office.

## 2024-07-24 ENCOUNTER — Ambulatory Visit: Payer: 59

## 2024-07-25 LAB — CUP PACEART REMOTE DEVICE CHECK
Battery Remaining Longevity: 65 mo
Battery Remaining Percentage: 59 %
Battery Voltage: 2.99 V
Brady Statistic AP VP Percent: 1 %
Brady Statistic AP VS Percent: 97 %
Brady Statistic AS VP Percent: 1 %
Brady Statistic AS VS Percent: 3.1 %
Brady Statistic RA Percent Paced: 97 %
Brady Statistic RV Percent Paced: 1 %
Date Time Interrogation Session: 20260205020015
Implantable Lead Connection Status: 753985
Implantable Lead Connection Status: 753985
Implantable Lead Implant Date: 20210812
Implantable Lead Implant Date: 20210812
Implantable Lead Location: 753859
Implantable Lead Location: 753860
Implantable Pulse Generator Implant Date: 20210812
Lead Channel Impedance Value: 440 Ohm
Lead Channel Impedance Value: 440 Ohm
Lead Channel Pacing Threshold Amplitude: 0.75 V
Lead Channel Pacing Threshold Amplitude: 1.25 V
Lead Channel Pacing Threshold Pulse Width: 0.5 ms
Lead Channel Pacing Threshold Pulse Width: 0.5 ms
Lead Channel Sensing Intrinsic Amplitude: 11.3 mV
Lead Channel Sensing Intrinsic Amplitude: 5 mV
Lead Channel Setting Pacing Amplitude: 2 V
Lead Channel Setting Pacing Amplitude: 2.5 V
Lead Channel Setting Pacing Pulse Width: 0.5 ms
Lead Channel Setting Sensing Sensitivity: 2 mV
Pulse Gen Model: 2272
Pulse Gen Serial Number: 3852532

## 2024-08-19 ENCOUNTER — Ambulatory Visit: Admitting: Physician Assistant
